# Patient Record
Sex: Male | Born: 1950 | Race: White | Hispanic: No | Marital: Married | Smoking: Former smoker
Health system: Southern US, Community
[De-identification: ages and names within clinical notes are randomized; demographics above are authoritative.]

## PROBLEM LIST (undated history)

## (undated) DIAGNOSIS — I10 Essential (primary) hypertension: Secondary | ICD-10-CM

## (undated) DIAGNOSIS — C801 Malignant (primary) neoplasm, unspecified: Secondary | ICD-10-CM

## (undated) DIAGNOSIS — M1990 Unspecified osteoarthritis, unspecified site: Secondary | ICD-10-CM

## (undated) DIAGNOSIS — I519 Heart disease, unspecified: Secondary | ICD-10-CM

## (undated) DIAGNOSIS — I2510 Atherosclerotic heart disease of native coronary artery without angina pectoris: Secondary | ICD-10-CM

## (undated) DIAGNOSIS — Z955 Presence of coronary angioplasty implant and graft: Secondary | ICD-10-CM

## (undated) DIAGNOSIS — E785 Hyperlipidemia, unspecified: Secondary | ICD-10-CM

## (undated) DIAGNOSIS — C44311 Basal cell carcinoma of skin of nose: Secondary | ICD-10-CM

## (undated) DIAGNOSIS — I219 Acute myocardial infarction, unspecified: Secondary | ICD-10-CM

## (undated) DIAGNOSIS — I25708 Atherosclerosis of coronary artery bypass graft(s), unspecified, with other forms of angina pectoris: Secondary | ICD-10-CM

## (undated) DIAGNOSIS — I255 Ischemic cardiomyopathy: Secondary | ICD-10-CM

## (undated) DIAGNOSIS — Z87442 Personal history of urinary calculi: Secondary | ICD-10-CM

## (undated) DIAGNOSIS — Z87891 Personal history of nicotine dependence: Secondary | ICD-10-CM

## (undated) HISTORY — PX: TONSILLECTOMY: SUR1361

## (undated) HISTORY — PX: BASAL CELL CARCINOMA EXCISION: SHX1214

## (undated) HISTORY — PX: CORONARY ARTERY BYPASS GRAFT: SHX141

## (undated) HISTORY — PX: CARDIAC CATHETERIZATION: SHX172

## (undated) HISTORY — PX: JOINT REPLACEMENT: SHX530

## (undated) HISTORY — DX: Basal cell carcinoma of skin of nose: C44.311

## (undated) HISTORY — DX: Hyperlipidemia, unspecified: E78.5

## (undated) HISTORY — PX: CORONARY ANGIOPLASTY: SHX604

## (undated) HISTORY — DX: Unspecified osteoarthritis, unspecified site: M19.90

## (undated) HISTORY — DX: Heart disease, unspecified: I51.9

## (undated) HISTORY — DX: Malignant (primary) neoplasm, unspecified: C80.1

## (undated) HISTORY — DX: Acute myocardial infarction, unspecified: I21.9

---

## 1996-07-19 DIAGNOSIS — I219 Acute myocardial infarction, unspecified: Secondary | ICD-10-CM

## 1996-07-19 DIAGNOSIS — Z951 Presence of aortocoronary bypass graft: Secondary | ICD-10-CM

## 1996-07-19 HISTORY — PX: CORONARY ARTERY BYPASS GRAFT: SHX141

## 1996-07-19 HISTORY — DX: Acute myocardial infarction, unspecified: I21.9

## 1996-07-19 HISTORY — DX: Presence of aortocoronary bypass graft: Z95.1

## 2002-07-19 HISTORY — PX: COLONOSCOPY: SHX174

## 2003-07-20 HISTORY — PX: ROTATOR CUFF REPAIR: SHX139

## 2004-03-31 ENCOUNTER — Other Ambulatory Visit: Payer: Self-pay

## 2006-03-10 ENCOUNTER — Ambulatory Visit: Payer: Self-pay | Admitting: Internal Medicine

## 2006-07-19 HISTORY — PX: HERNIA REPAIR: SHX51

## 2007-01-20 ENCOUNTER — Inpatient Hospital Stay: Payer: Self-pay | Admitting: Internal Medicine

## 2007-01-20 ENCOUNTER — Other Ambulatory Visit: Payer: Self-pay

## 2007-04-13 ENCOUNTER — Ambulatory Visit: Payer: Self-pay | Admitting: General Surgery

## 2007-06-23 ENCOUNTER — Ambulatory Visit: Payer: Self-pay | Admitting: Family Medicine

## 2007-07-25 ENCOUNTER — Ambulatory Visit: Payer: Self-pay | Admitting: Specialist

## 2008-06-17 ENCOUNTER — Ambulatory Visit: Payer: Self-pay | Admitting: Specialist

## 2009-05-07 LAB — BASIC METABOLIC PANEL
BUN: 14 mg/dL (ref 4–21)
CREATININE: 0.9 mg/dL (ref 0.6–1.3)
GLUCOSE: 99 mg/dL
POTASSIUM: 4.4 mmol/L (ref 3.4–5.3)
SODIUM: 140 mmol/L (ref 137–147)

## 2009-07-19 DIAGNOSIS — C801 Malignant (primary) neoplasm, unspecified: Secondary | ICD-10-CM

## 2009-07-19 HISTORY — DX: Malignant (primary) neoplasm, unspecified: C80.1

## 2011-02-01 ENCOUNTER — Ambulatory Visit: Payer: Self-pay | Admitting: Family Medicine

## 2011-03-07 ENCOUNTER — Emergency Department: Payer: Self-pay | Admitting: Emergency Medicine

## 2011-11-07 ENCOUNTER — Emergency Department: Payer: Self-pay | Admitting: Emergency Medicine

## 2011-11-16 ENCOUNTER — Emergency Department: Payer: Self-pay | Admitting: Emergency Medicine

## 2013-05-18 IMAGING — CR DG HIP COMPLETE 2+V*L*
1 series · 3 of 3 positions shown · non-contrast
Comparison: none

REASON FOR EXAM: left hip pain
COMMENTS:

[Series 1: view not recorded · 0.17mm/px · 3 of 3 slices shown]
[im 1/3]
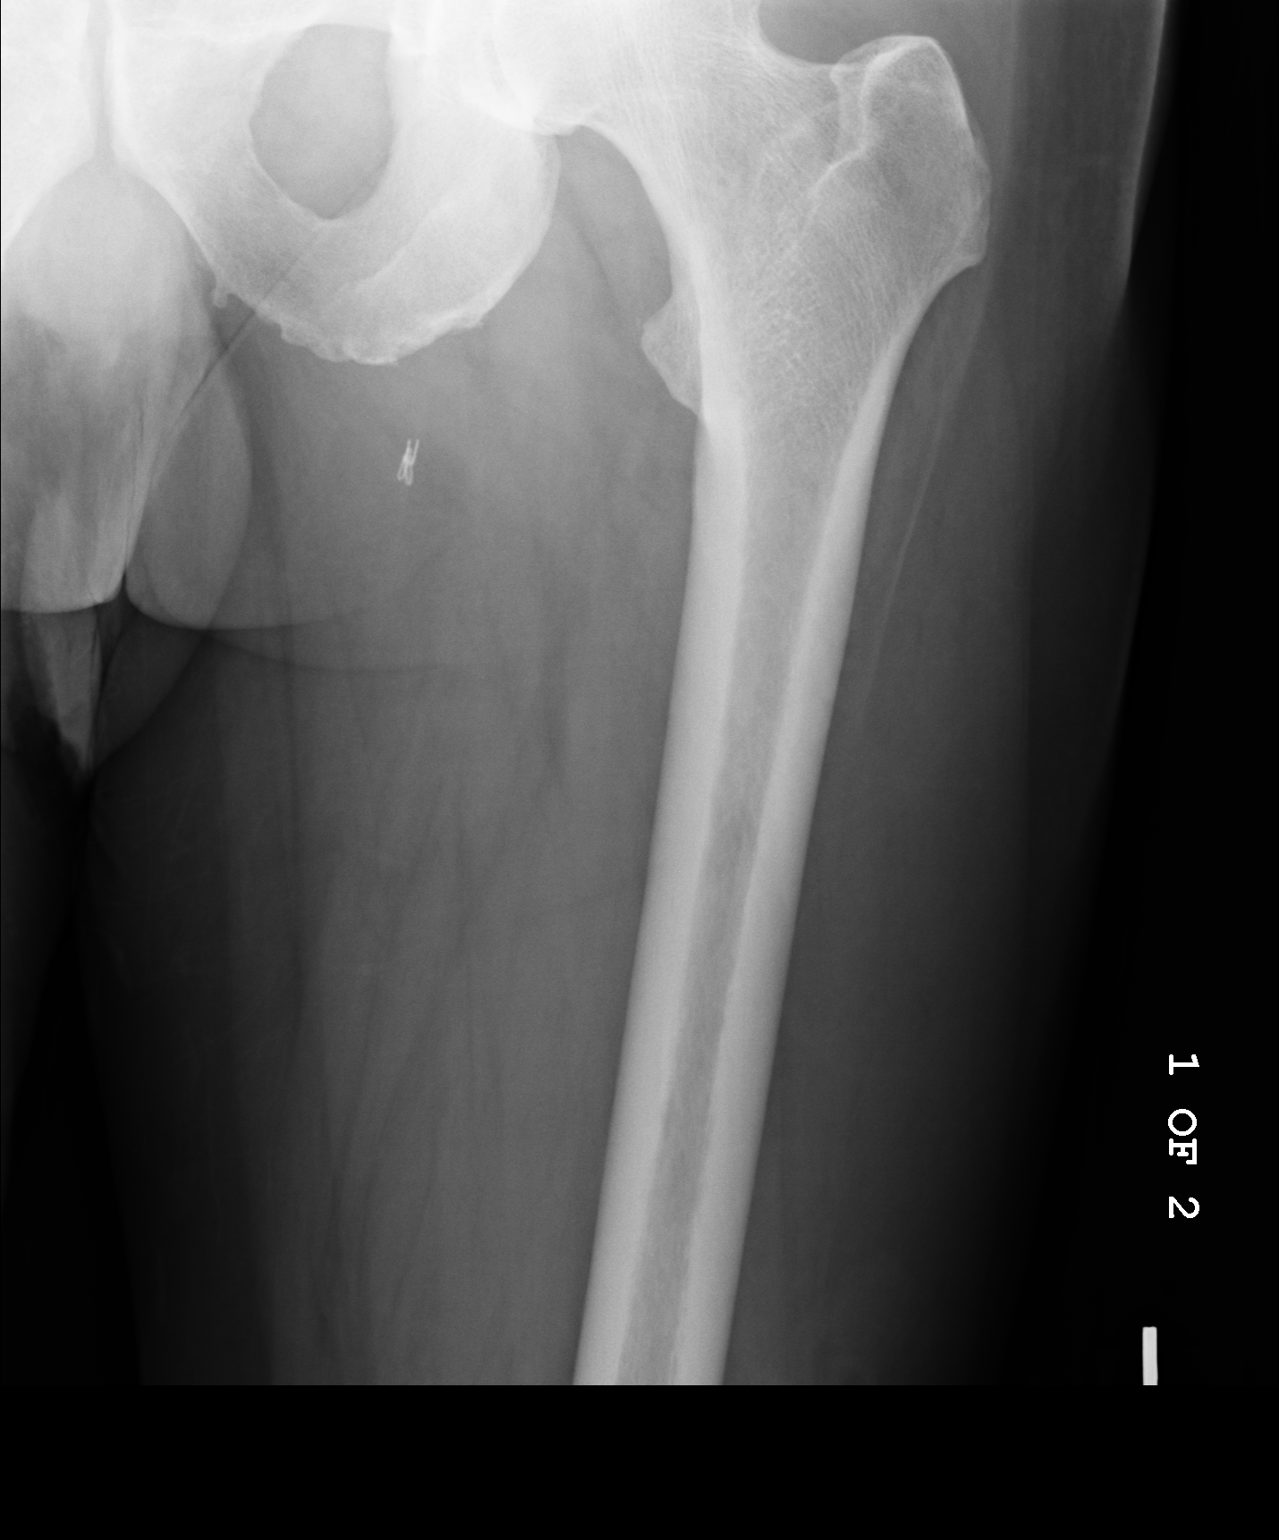
[im 2/3]
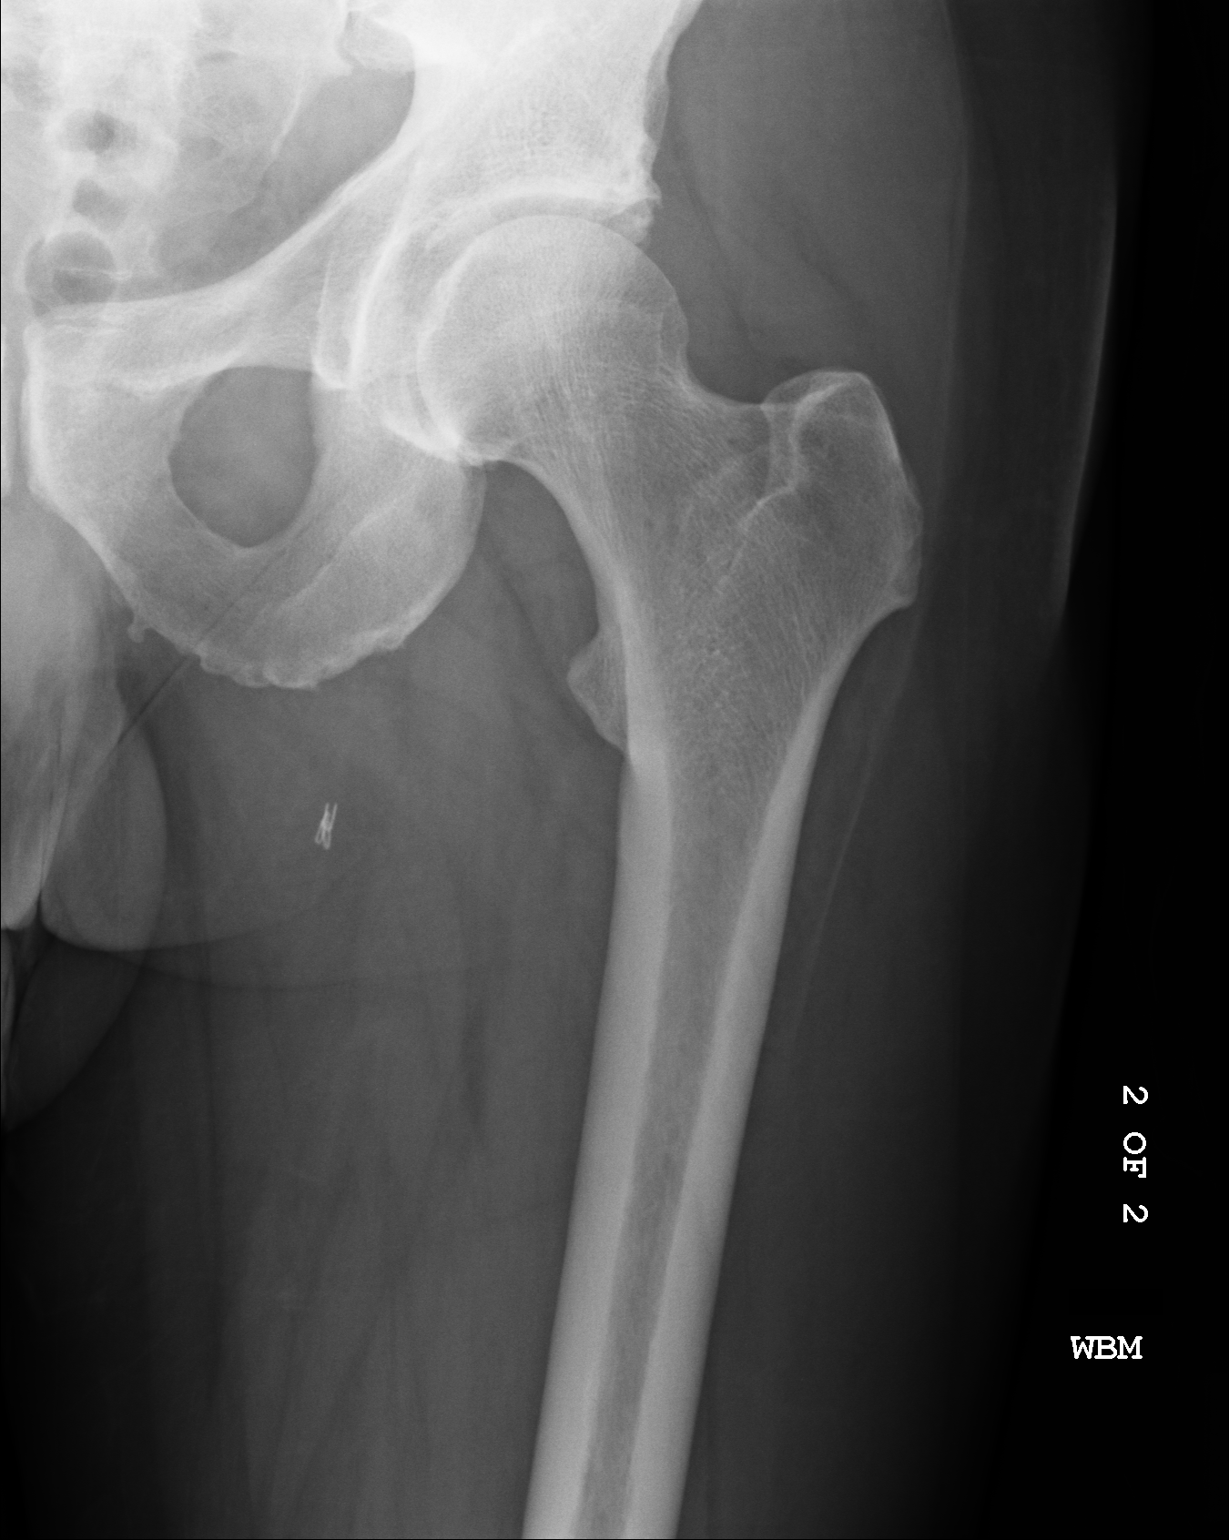
[im 3/3]
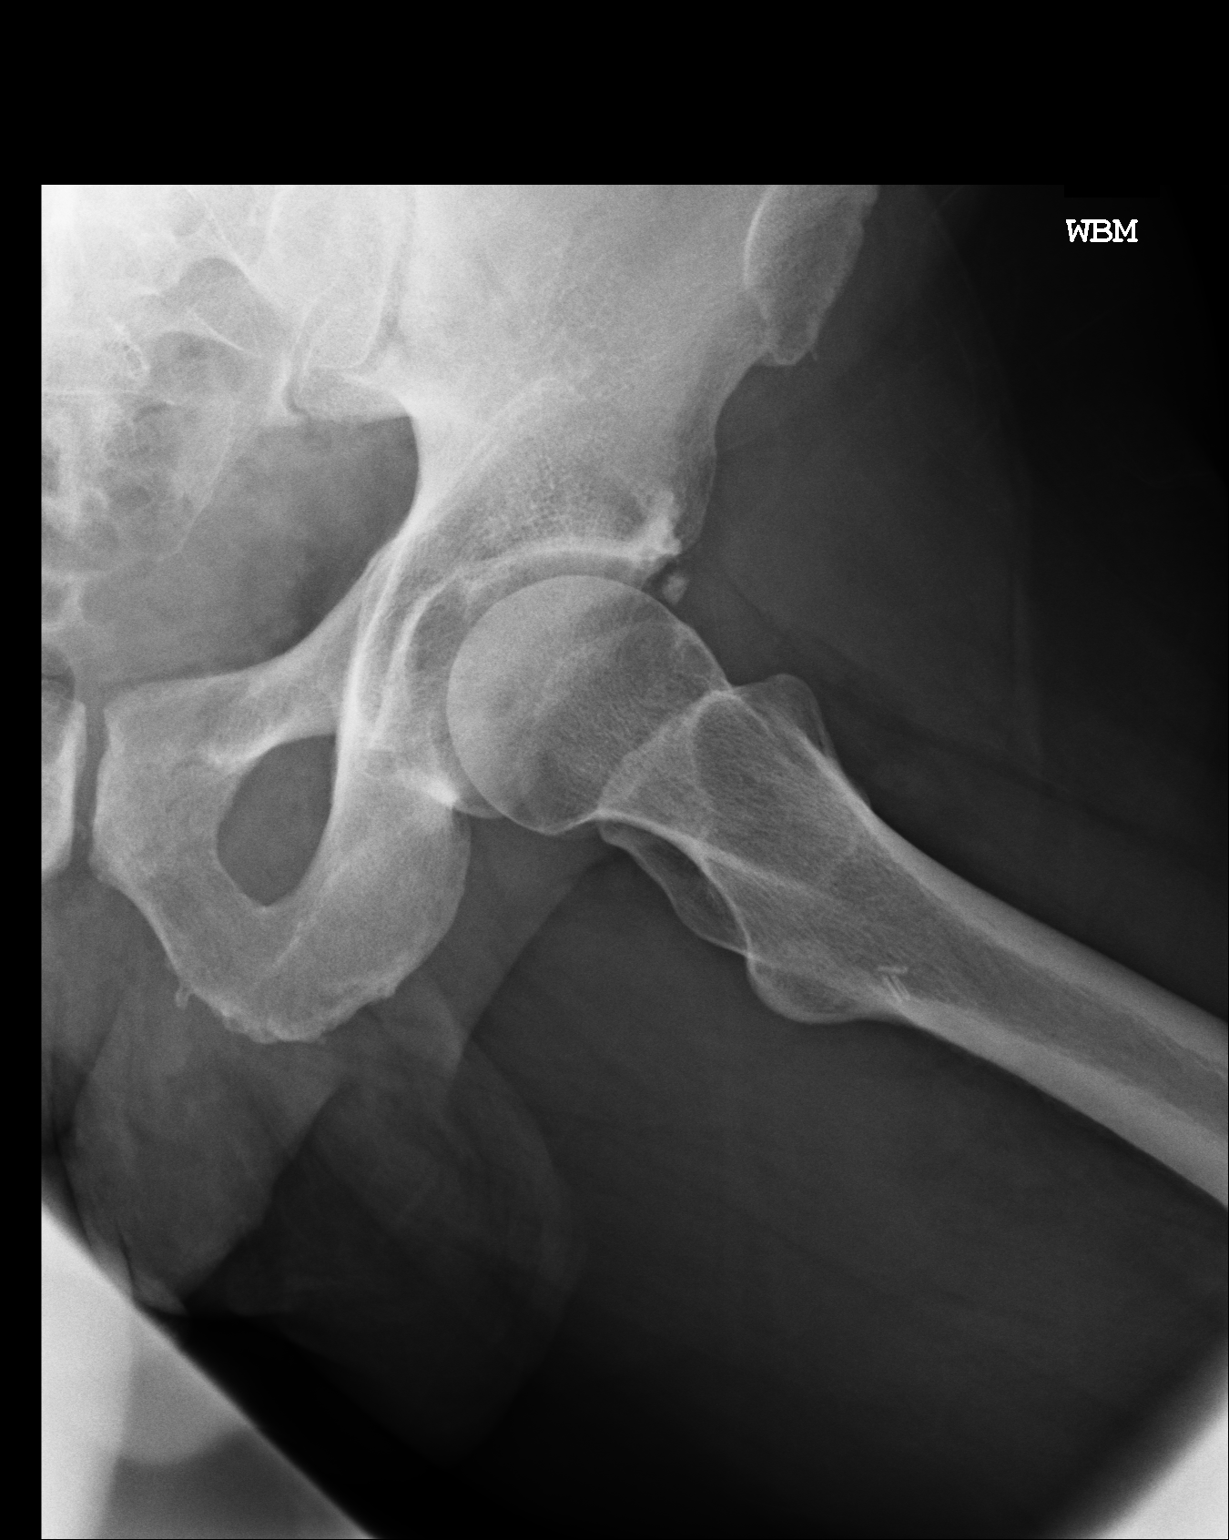

[3 of 3 positions shown; findings below may reference images not displayed]

PROCEDURE:     KDR - KDXR HIP LEFT COMPLETE  - February 01, 2011  [DATE]

RESULT:     There is no evidence of acute fracture or dislocation. There are
findings concerning for an osseous loose body within the femoral acetabular
joint. This area projects along superior lateral portion of the joint.
Osteoarthritic changes are identified evident by mild osteophytosis.
IMPRESSION: 1. No evidence of acute osseous abnormalities.
2. Findings concerning for an osseous loose body within the joint. If
clinically warranted, further evaluation with MRI is recommended as well as
orthopedic consultation.

## 2013-06-07 ENCOUNTER — Encounter: Payer: Self-pay | Admitting: *Deleted

## 2013-06-28 ENCOUNTER — Encounter: Payer: Self-pay | Admitting: General Surgery

## 2013-06-28 ENCOUNTER — Other Ambulatory Visit: Payer: Self-pay | Admitting: General Surgery

## 2013-06-28 ENCOUNTER — Ambulatory Visit (INDEPENDENT_AMBULATORY_CARE_PROVIDER_SITE_OTHER): Payer: 59 | Admitting: General Surgery

## 2013-06-28 VITALS — BP 110/66 | HR 80 | Resp 16 | Ht 63.0 in | Wt 141.0 lb

## 2013-06-28 DIAGNOSIS — K4090 Unilateral inguinal hernia, without obstruction or gangrene, not specified as recurrent: Secondary | ICD-10-CM

## 2013-06-28 NOTE — Progress Notes (Signed)
Patient ID: Bruce Moody, male   DOB: 14-Dec-1950, 62 y.o.   MRN: [id number]  Chief Complaint  Patient presents with  . Other    inguinal hernia    HPI Bruce Moody is a 62 y.o. male referred here by Dr Sherrie Mustache for a Right inguinal hernia. Patient notices this about one month ago . He states the inguinal area is popping in and out. Patient states when walking and standing the area starts to burn.   The patient reports that he has had rare episodes of angina since his bypass 16 years ago. He is able to hike in the mountains while doing his photography without symptoms. He retired from [employer]. 3 years ago.  HPI  Past Medical History  Diagnosis Date  . Heart disease   . Arthritis   . Myocardial infarction   . Cancer 2011    Melanoma    Past Surgical History  Procedure Laterality Date  . Rotator cuff repair  2005  . Coronary artery bypass graft  1998  . Hernia repair Left 2008    inguinal  . Colonoscopy  2004    Family History  Problem Relation Age of Onset  . Lymphoma Father   . Heart disease Mother     Social History History  Substance Use Topics  . Smoking status: Former Smoker    Types: Cigarettes    Quit date: 07/19/1996  . Smokeless tobacco: Never Used  . Alcohol Use: Yes    No Known Allergies  Current Outpatient Prescriptions  Medication Sig Dispense Refill  . aspirin 81 MG tablet Take 81 mg by mouth daily.      Marland Kitchen atorvastatin (LIPITOR) 40 MG tablet Take 40 mg by mouth daily at 6 PM.       . carvedilol (COREG) 3.125 MG tablet Take 3.125 mg by mouth 2 (two) times daily with a meal.       . clopidogrel (PLAVIX) 75 MG tablet Take 75 mg by mouth once.       . ramipril (ALTACE) 2.5 MG capsule Take 2.5 mg by mouth daily.        No current facility-administered medications for this visit.    Review of Systems Review of Systems  Constitutional: Negative.   Respiratory: Negative.   Cardiovascular: Negative.     Blood pressure 110/66, pulse 80, resp. rate  16, height 5\' 3"  (1.6 m), weight 141 lb (63.957 kg).  Physical Exam Physical Exam  Constitutional: He is oriented to person, place, and time. He appears well-developed and well-nourished.  Eyes: No scleral icterus.  Cardiovascular: Normal rate, regular rhythm and normal heart sounds.   Pulmonary/Chest: Breath sounds normal.  Abdominal: Soft. Normal appearance and bowel sounds are normal. There is no hepatomegaly. There is no tenderness. A hernia is present. Hernia confirmed positive in the right inguinal area. Hernia confirmed negative in the left inguinal area.  Lymphadenopathy:    He has no cervical adenopathy.  Neurological: He is alert and oriented to person, place, and time.  Skin: Skin is warm and dry.    Data Reviewed None.  Assessment    Symptomatic right inguinal hernia.     Plan    The indications for elective repair was reviewed. Plan is for the use of prosthetic mesh was discussed. His previous procedure and 2008 was completed with local anesthesia with sedation. Will have him discuss with the anesthesia staff his desires.     The patient will discontinue Plavix one week prior to  the procedure.    Patient's surgery has been scheduled for 07-05-13 at [facility]. This patient has been asked to discontinue Plavix for one week prior to procedure but aware it is ok to continue 81 mg aspirin.    Earline Mayotte 06/28/2013, 8:04 PM

## 2013-06-28 NOTE — Patient Instructions (Addendum)
Inguinal Hernia, Adult Muscles help keep everything in the body in its proper place. But if a weak spot in the muscles develops, something can poke through. That is called a hernia. When this happens in the lower part of the belly (abdomen), it is called an inguinal hernia. (It takes its name from a part of the body in this region called the inguinal canal.) A weak spot in the wall of muscles lets some fat or part of the small intestine bulge through. An inguinal hernia can develop at any age. Men get them more often than women. CAUSES  In adults, an inguinal hernia develops over time.  It can be triggered by:  Suddenly straining the muscles of the lower abdomen.  Lifting heavy objects.  Straining to have a bowel movement. Difficult bowel movements (constipation) can lead to this.  Constant coughing. This may be caused by smoking or lung disease.  Being overweight.  Being pregnant.  Working at a job that requires long periods of standing or heavy lifting.  Having had an inguinal hernia before. One type can be an emergency situation. It is called a strangulated inguinal hernia. It develops if part of the small intestine slips through the weak spot and cannot get back into the abdomen. The blood supply can be cut off. If that happens, part of the intestine may die. This situation requires emergency surgery. SYMPTOMS  Often, a small inguinal hernia has no symptoms. It is found when a healthcare provider does a physical exam. Larger hernias usually have symptoms.   In adults, symptoms may include:  A lump in the groin. This is easier to see when the person is standing. It might disappear when lying down.  In men, a lump in the scrotum.  Pain or burning in the groin. This occurs especially when lifting, straining or coughing.  A dull ache or feeling of pressure in the groin.  Signs of a strangulated hernia can include:  A bulge in the groin that becomes very painful and tender to the  touch.  A bulge that turns red or purple.  Fever, nausea and vomiting.  Inability to have a bowel movement or to pass gas. DIAGNOSIS  To decide if you have an inguinal hernia, a healthcare provider will probably do a physical examination.  This will include asking questions about any symptoms you have noticed.  The healthcare provider might feel the groin area and ask you to cough. If an inguinal hernia is felt, the healthcare provider may try to slide it back into the abdomen.  Usually no other tests are needed. TREATMENT  Treatments can vary. The size of the hernia makes a difference. Options include:  Watchful waiting. This is often suggested if the hernia is small and you have had no symptoms.  No medical procedure will be done unless symptoms develop.  You will need to watch closely for symptoms. If any occur, contact your healthcare provider right away.  Surgery. This is used if the hernia is larger or you have symptoms.  Open surgery. This is usually an outpatient procedure (you will not stay overnight in a hospital). An cut (incision) is made through the skin in the groin. The hernia is put back inside the abdomen. The weak area in the muscles is then repaired by herniorrhaphy or hernioplasty. Herniorrhaphy: in this type of surgery, the weak muscles are sewn back together. Hernioplasty: a patch or mesh is used to close the weak area in the abdominal wall.  Laparoscopy.   In this procedure, a surgeon makes small incisions. A thin tube with a tiny video camera (called a laparoscope) is put into the abdomen. The surgeon repairs the hernia with mesh by looking with the video camera and using two long instruments. HOME CARE INSTRUCTIONS   After surgery to repair an inguinal hernia:  You will need to take pain medicine prescribed by your healthcare provider. Follow all directions carefully.  You will need to take care of the wound from the incision.  Your activity will be  restricted for awhile. This will probably include no heavy lifting for several weeks. You also should not do anything too active for a few weeks. When you can return to work will depend on the type of job that you have.  During "watchful waiting" periods, you should:  Maintain a healthy weight.  Eat a diet high in fiber (fruits, vegetables and whole grains).  Drink plenty of fluids to avoid constipation. This means drinking enough water and other liquids to keep your urine clear or pale yellow.  Do not lift heavy objects.  Do not stand for long periods of time.  Quit smoking. This should keep you from developing a frequent cough. SEEK MEDICAL CARE IF:   A bulge develops in your groin area.  You feel pain, a burning sensation or pressure in the groin. This might be worse if you are lifting or straining.  You develop a fever of more than 100.5 F (38.1 C). SEEK IMMEDIATE MEDICAL CARE IF:   Pain in the groin increases suddenly.  A bulge in the groin gets bigger suddenly and does not go down.  For men, there is sudden pain in the scrotum. Or, the size of the scrotum increases.  A bulge in the groin area becomes red or purple and is painful to touch.  You have nausea or vomiting that does not go away.  You feel your heart beating much faster than normal.  You cannot have a bowel movement or pass gas.  You develop a fever of more than 102.0 F (38.9 C). Document Released: 11/21/2008 Document Revised: 09/27/2011 Document Reviewed: 11/21/2008 [facility] Patient Information 2014 [place], [place].  Patient's surgery has been scheduled for 07-05-13 at [facility]. This patient has been asked to discontinue Plavix for one week prior to procedure but aware it is ok to continue 81 mg aspirin.

## 2013-07-03 ENCOUNTER — Ambulatory Visit: Payer: Self-pay | Admitting: General Surgery

## 2013-07-03 LAB — CBC WITH DIFFERENTIAL/PLATELET
Basophil #: 0 10*3/uL (ref 0.0–0.1)
Basophil %: 0.7 %
Eosinophil #: 0.2 10*3/uL (ref 0.0–0.7)
Eosinophil %: 2.9 %
HGB: 15.2 g/dL (ref 13.0–18.0)
Lymphocyte #: 2.3 10*3/uL (ref 1.0–3.6)
Lymphocyte %: 33.6 %
MCH: 32.3 pg (ref 26.0–34.0)
Monocyte #: 0.8 x10 3/mm (ref 0.2–1.0)
Monocyte %: 12.4 %
Neutrophil %: 50.4 %
Platelet: 240 10*3/uL (ref 150–440)
RDW: 12.8 % (ref 11.5–14.5)
WBC: 6.7 10*3/uL (ref 3.8–10.6)

## 2013-07-05 ENCOUNTER — Ambulatory Visit: Payer: Self-pay | Admitting: General Surgery

## 2013-07-05 DIAGNOSIS — K4090 Unilateral inguinal hernia, without obstruction or gangrene, not specified as recurrent: Secondary | ICD-10-CM

## 2013-07-05 HISTORY — PX: HERNIA REPAIR: SHX51

## 2013-07-09 ENCOUNTER — Encounter: Payer: Self-pay | Admitting: General Surgery

## 2013-07-18 ENCOUNTER — Ambulatory Visit (INDEPENDENT_AMBULATORY_CARE_PROVIDER_SITE_OTHER): Payer: 59 | Admitting: General Surgery

## 2013-07-18 ENCOUNTER — Encounter: Payer: Self-pay | Admitting: General Surgery

## 2013-07-18 VITALS — BP 128/72 | HR 76 | Resp 12 | Ht 63.0 in | Wt 142.0 lb

## 2013-07-18 DIAGNOSIS — K4090 Unilateral inguinal hernia, without obstruction or gangrene, not specified as recurrent: Secondary | ICD-10-CM

## 2013-07-18 NOTE — Progress Notes (Signed)
Patient ID: Bruce Moody, male   DOB: 1951-01-22, 62 y.o.   MRN: [id number]  Chief Complaint  Patient presents with  . Routine Post Op    HPI Bruce Moody is a 62 y.o. male.  Here today for postoperative visit he had repair of right inguinal hernia 07-05-13. Repair was completed making use of a large Ultra Pro mesh. States he is getting a long well.  HPI  Past Medical History  Diagnosis Date  . Heart disease   . Arthritis   . Myocardial infarction   . Cancer 2011    Melanoma    Past Surgical History  Procedure Laterality Date  . Rotator cuff repair  2005  . Coronary artery bypass graft  1998  . Colonoscopy  2004  . Hernia repair Left 2008    inguinal  . Hernia repair Right 07-05-13    inguinal     Family History  Problem Relation Age of Onset  . Lymphoma Father   . Heart disease Mother     Social History History  Substance Use Topics  . Smoking status: Former Smoker    Types: Cigarettes    Quit date: 07/19/1996  . Smokeless tobacco: Never Used  . Alcohol Use: Yes    No Known Allergies  Current Outpatient Prescriptions  Medication Sig Dispense Refill  . aspirin 81 MG tablet Take 81 mg by mouth daily.      Marland Kitchen atorvastatin (LIPITOR) 40 MG tablet Take 40 mg by mouth daily at 6 PM.       . carvedilol (COREG) 3.125 MG tablet Take 3.125 mg by mouth 2 (two) times daily with a meal.       . clopidogrel (PLAVIX) 75 MG tablet Take 75 mg by mouth once.       . ramipril (ALTACE) 2.5 MG capsule Take 2.5 mg by mouth daily.        No current facility-administered medications for this visit.    Review of Systems Review of Systems  Constitutional: Negative.   Respiratory: Negative.   Cardiovascular: Negative.     Blood pressure 128/72, pulse 76, resp. rate 12, height 5\' 3"  (1.6 m), weight 142 lb (64.411 kg).  Physical Exam Physical Exam  Constitutional: He is oriented to person, place, and time. He appears well-developed and well-nourished.  Abdominal: Hernia  confirmed negative in the right inguinal area.  Neurological: He is alert and oriented to person, place, and time.  Skin: Skin is warm and dry.  Right inguinal incision healing well.      Assessment    Doing well status post right inguinal hernia repair.     Plan    Patient may resume activities as tolerated, taking care to use proper technique when lifting (demonstrated).       Earline Mayotte 07/20/2013, 1:51 PM

## 2013-07-18 NOTE — Patient Instructions (Addendum)
Patient may resume activities as tolerated, taking care to use proper technique when lifting. 

## 2013-08-02 ENCOUNTER — Encounter: Payer: Self-pay | Admitting: General Surgery

## 2013-11-20 DIAGNOSIS — I25810 Atherosclerosis of coronary artery bypass graft(s) without angina pectoris: Secondary | ICD-10-CM | POA: Insufficient documentation

## 2013-11-20 DIAGNOSIS — Z955 Presence of coronary angioplasty implant and graft: Secondary | ICD-10-CM | POA: Insufficient documentation

## 2013-11-21 DIAGNOSIS — C44121 Squamous cell carcinoma of skin of unspecified eyelid, including canthus: Secondary | ICD-10-CM | POA: Insufficient documentation

## 2014-02-20 LAB — CBC AND DIFFERENTIAL
HEMATOCRIT: 44 % (ref 41–53)
Hemoglobin: 15.6 g/dL (ref 13.5–17.5)
Platelets: 285 10*3/uL (ref 150–399)
WBC: 8.8 10^3/mL

## 2014-02-20 LAB — PSA: PSA: 1.3

## 2014-02-20 LAB — TSH: TSH: 3.47 u[IU]/mL (ref 0.41–5.90)

## 2014-04-02 DIAGNOSIS — E782 Mixed hyperlipidemia: Secondary | ICD-10-CM | POA: Insufficient documentation

## 2014-04-02 DIAGNOSIS — I255 Ischemic cardiomyopathy: Secondary | ICD-10-CM | POA: Insufficient documentation

## 2014-04-02 DIAGNOSIS — I10 Essential (primary) hypertension: Secondary | ICD-10-CM | POA: Insufficient documentation

## 2014-04-02 DIAGNOSIS — I2510 Atherosclerotic heart disease of native coronary artery without angina pectoris: Secondary | ICD-10-CM | POA: Insufficient documentation

## 2014-05-28 ENCOUNTER — Ambulatory Visit: Payer: Self-pay | Admitting: Gastroenterology

## 2014-05-28 LAB — HM COLONOSCOPY

## 2014-11-08 NOTE — Op Note (Signed)
PATIENT NAMEMARKISE, Bruce Moody MR#:  [id number] DATE OF BIRTH:  March 14, 1951  DATE OF PROCEDURE:  07/05/2013  PREOPERATIVE DIAGNOSIS:  Symptomatic right inguinal hernia.   POSTOPERATIVE DIAGNOSIS: Symptomatic right inguinal hernia.   OPERATIVE PROCEDURE: Right inguinal hernia repair with large Ultrapro mesh.   OPERATING SURGEON: Hervey Ard, MD   ANESTHESIA: General by LMA, Marcaine 0.5% with 1:200,000 units of epinephrine, 30 mL local infiltration, Toradol 30 mg.   ESTIMATED BLOOD LOSS: Minimal.   CLINICAL NOTE: This 64 year old male has developed symptomatic inguinal hernia. He was admitted for elective repair. He received Kefzol prior to the procedure.   OPERATIVE NOTE: With the patient under adequate general anesthesia, the area was prepped with ChloraPrep and draped. Hair had previously been removed with clippers. A 5 cm skin line incision was made along the anticipated path of the inguinal canal and carried down through the skin and subcutaneous tissue, with hemostasis achieved by electrocautery and 3-0 Vicryl ties. The external oblique was opened in the direction of its fibers. The cord was mobilized. Dissection of the level of the internal ring showed a lipoma of the cord, which was excised and discarded. There was no evidence of an indirect sac. There was a sizable direct defect. The sac was excised. The preperitoneal space was cleared, and a large Ultrapro mesh smoothed into a preperitoneal space and then the external component along the floor of the inguinal canal. The mesh was anchored to the pubic tubercle with 0 Surgilon, and interrupted 0 Surgilon sutures were used along the course of the inguinal ligament as well as the medial and superior borders to the transverse abdominis aponeurosis. The hypogastric and ilioinguinal nerves were identified and protected. The external oblique was closed in the direction of its fibers after the instillation of Toradol. Scarpa's fascia was closed  with 3-0 Vicryl, and the skin closed with a running 4-0 Vicryl subcuticular suture. Benzoin, Steri-Strips, Telfa, and Tegaderm dressing was then applied.   The patient tolerated the procedure well, was taken to the recovery room in stable condition.     ____________________________ Robert Bellow, MD jwb:mr D: 07/05/2013 20:08:27 ET T: 07/05/2013 20:39:17 ET JOB#: [id number]  cc: Robert Bellow, MD, <Dictator> Kirstie Peri. Caryn Section, MD  Adonis Yim Amedeo Kinsman MD ELECTRONICALLY SIGNED 07/06/2013 22:10

## 2015-03-05 LAB — HEPATIC FUNCTION PANEL
ALK PHOS: 80 U/L (ref 25–125)
ALT: 31 U/L (ref 10–40)
AST: 30 U/L (ref 14–40)
BILIRUBIN, TOTAL: 0.9 mg/dL

## 2015-03-05 LAB — LIPID PANEL
Cholesterol: 162 mg/dL (ref 0–200)
HDL: 53 mg/dL (ref 35–70)
LDL Cholesterol: 92 mg/dL
LDl/HDL Ratio: 3
TRIGLYCERIDES: 83 mg/dL (ref 40–160)

## 2015-06-05 ENCOUNTER — Ambulatory Visit (INDEPENDENT_AMBULATORY_CARE_PROVIDER_SITE_OTHER): Payer: Medicare Other

## 2015-06-05 DIAGNOSIS — Z23 Encounter for immunization: Secondary | ICD-10-CM | POA: Diagnosis not present

## 2015-09-30 DIAGNOSIS — I25810 Atherosclerosis of coronary artery bypass graft(s) without angina pectoris: Secondary | ICD-10-CM | POA: Diagnosis not present

## 2015-09-30 DIAGNOSIS — I2510 Atherosclerotic heart disease of native coronary artery without angina pectoris: Secondary | ICD-10-CM | POA: Diagnosis not present

## 2015-09-30 DIAGNOSIS — E782 Mixed hyperlipidemia: Secondary | ICD-10-CM | POA: Diagnosis not present

## 2015-09-30 DIAGNOSIS — I10 Essential (primary) hypertension: Secondary | ICD-10-CM | POA: Diagnosis not present

## 2015-09-30 DIAGNOSIS — I255 Ischemic cardiomyopathy: Secondary | ICD-10-CM | POA: Diagnosis not present

## 2015-11-26 DIAGNOSIS — L570 Actinic keratosis: Secondary | ICD-10-CM | POA: Diagnosis not present

## 2015-11-26 DIAGNOSIS — X32XXXA Exposure to sunlight, initial encounter: Secondary | ICD-10-CM | POA: Diagnosis not present

## 2015-11-26 DIAGNOSIS — Z85828 Personal history of other malignant neoplasm of skin: Secondary | ICD-10-CM | POA: Diagnosis not present

## 2015-11-26 DIAGNOSIS — Z08 Encounter for follow-up examination after completed treatment for malignant neoplasm: Secondary | ICD-10-CM | POA: Diagnosis not present

## 2015-11-26 DIAGNOSIS — D485 Neoplasm of uncertain behavior of skin: Secondary | ICD-10-CM | POA: Diagnosis not present

## 2015-11-26 DIAGNOSIS — D0439 Carcinoma in situ of skin of other parts of face: Secondary | ICD-10-CM | POA: Diagnosis not present

## 2015-11-26 DIAGNOSIS — Z85820 Personal history of malignant melanoma of skin: Secondary | ICD-10-CM | POA: Diagnosis not present

## 2015-11-26 DIAGNOSIS — D0339 Melanoma in situ of other parts of face: Secondary | ICD-10-CM | POA: Diagnosis not present

## 2015-12-29 DIAGNOSIS — N200 Calculus of kidney: Secondary | ICD-10-CM | POA: Insufficient documentation

## 2015-12-29 DIAGNOSIS — N529 Male erectile dysfunction, unspecified: Secondary | ICD-10-CM | POA: Insufficient documentation

## 2015-12-29 DIAGNOSIS — K5792 Diverticulitis of intestine, part unspecified, without perforation or abscess without bleeding: Secondary | ICD-10-CM | POA: Insufficient documentation

## 2015-12-29 DIAGNOSIS — I509 Heart failure, unspecified: Secondary | ICD-10-CM | POA: Insufficient documentation

## 2016-01-12 DIAGNOSIS — L908 Other atrophic disorders of skin: Secondary | ICD-10-CM | POA: Diagnosis not present

## 2016-01-12 DIAGNOSIS — D0439 Carcinoma in situ of skin of other parts of face: Secondary | ICD-10-CM | POA: Diagnosis not present

## 2016-01-12 DIAGNOSIS — L578 Other skin changes due to chronic exposure to nonionizing radiation: Secondary | ICD-10-CM | POA: Diagnosis not present

## 2016-01-12 DIAGNOSIS — L814 Other melanin hyperpigmentation: Secondary | ICD-10-CM | POA: Diagnosis not present

## 2016-01-21 DIAGNOSIS — T8131XA Disruption of external operation (surgical) wound, not elsewhere classified, initial encounter: Secondary | ICD-10-CM | POA: Diagnosis not present

## 2016-04-07 DIAGNOSIS — I25810 Atherosclerosis of coronary artery bypass graft(s) without angina pectoris: Secondary | ICD-10-CM | POA: Diagnosis not present

## 2016-04-07 DIAGNOSIS — I10 Essential (primary) hypertension: Secondary | ICD-10-CM | POA: Diagnosis not present

## 2016-04-07 DIAGNOSIS — E782 Mixed hyperlipidemia: Secondary | ICD-10-CM | POA: Diagnosis not present

## 2016-04-07 DIAGNOSIS — M25552 Pain in left hip: Secondary | ICD-10-CM | POA: Diagnosis not present

## 2016-04-12 DIAGNOSIS — M25552 Pain in left hip: Secondary | ICD-10-CM | POA: Diagnosis not present

## 2016-04-12 DIAGNOSIS — M5440 Lumbago with sciatica, unspecified side: Secondary | ICD-10-CM | POA: Diagnosis not present

## 2016-04-12 DIAGNOSIS — M1612 Unilateral primary osteoarthritis, left hip: Secondary | ICD-10-CM | POA: Diagnosis not present

## 2016-04-12 DIAGNOSIS — M5136 Other intervertebral disc degeneration, lumbar region: Secondary | ICD-10-CM | POA: Diagnosis not present

## 2016-05-03 DIAGNOSIS — M1612 Unilateral primary osteoarthritis, left hip: Secondary | ICD-10-CM | POA: Diagnosis not present

## 2016-05-20 ENCOUNTER — Ambulatory Visit (INDEPENDENT_AMBULATORY_CARE_PROVIDER_SITE_OTHER): Payer: Medicare Other

## 2016-05-20 DIAGNOSIS — Z23 Encounter for immunization: Secondary | ICD-10-CM | POA: Diagnosis not present

## 2016-05-26 ENCOUNTER — Encounter
Admission: RE | Admit: 2016-05-26 | Discharge: 2016-05-26 | Disposition: A | Discharge status: Home / Self Care | Payer: Medicare Other | Source: Ambulatory Visit | Attending: Orthopedic Surgery | Admitting: Orthopedic Surgery

## 2016-05-26 DIAGNOSIS — Z01818 Encounter for other preprocedural examination: Secondary | ICD-10-CM | POA: Diagnosis not present

## 2016-05-26 DIAGNOSIS — I2510 Atherosclerotic heart disease of native coronary artery without angina pectoris: Secondary | ICD-10-CM | POA: Diagnosis not present

## 2016-05-26 HISTORY — DX: Essential (primary) hypertension: I10

## 2016-05-26 HISTORY — DX: Hyperlipidemia, unspecified: E78.5

## 2016-05-26 HISTORY — DX: Atherosclerotic heart disease of native coronary artery without angina pectoris: I25.10

## 2016-05-26 LAB — SURGICAL PCR SCREEN
MRSA, PCR: NEGATIVE
Staphylococcus aureus: NEGATIVE

## 2016-05-26 LAB — URINALYSIS COMPLETE WITH MICROSCOPIC (ARMC ONLY)
BACTERIA UA: NONE SEEN
Bilirubin Urine: NEGATIVE
Glucose, UA: NEGATIVE mg/dL
Ketones, ur: NEGATIVE mg/dL
Leukocytes, UA: NEGATIVE
NITRITE: NEGATIVE
PROTEIN: NEGATIVE mg/dL
SPECIFIC GRAVITY, URINE: 1.015 (ref 1.005–1.030)
Squamous Epithelial / LPF: NONE SEEN
WBC UA: NONE SEEN WBC/hpf (ref 0–5)
pH: 5 (ref 5.0–8.0)

## 2016-05-26 LAB — BASIC METABOLIC PANEL
Anion gap: 7 (ref 5–15)
BUN: 15 mg/dL (ref 6–20)
CALCIUM: 8.8 mg/dL — AB (ref 8.9–10.3)
CO2: 27 mmol/L (ref 22–32)
CREATININE: 0.82 mg/dL (ref 0.61–1.24)
Chloride: 103 mmol/L (ref 101–111)
GFR calc Af Amer: >60 mL/min (ref >60)
Glucose, Bld: 82 mg/dL (ref 65–99)
Potassium: 3.8 mmol/L (ref 3.5–5.1)
SODIUM: 137 mmol/L (ref 135–145)

## 2016-05-26 LAB — CBC
HCT: 43.9 % (ref 40.0–52.0)
Hemoglobin: 15 g/dL (ref 13.0–18.0)
MCH: 32.7 pg (ref 26.0–34.0)
MCHC: 34.2 g/dL (ref 32.0–36.0)
MCV: 95.7 fL (ref 80.0–100.0)
PLATELETS: 237 10*3/uL (ref 150–440)
RBC: 4.59 MIL/uL (ref 4.40–5.90)
RDW: 13 % (ref 11.5–14.5)
WBC: 6.9 10*3/uL (ref 3.8–10.6)

## 2016-05-26 LAB — PROTIME-INR
INR: 0.93
PROTHROMBIN TIME: 12.5 s (ref 11.4–15.2)

## 2016-05-26 LAB — SEDIMENTATION RATE: SED RATE: 7 mm/h (ref 0–20)

## 2016-05-26 LAB — TYPE AND SCREEN
ABO/RH(D): O POS
ANTIBODY SCREEN: NEGATIVE

## 2016-05-26 LAB — APTT: aPTT: 34 seconds (ref 24–36)

## 2016-05-26 NOTE — Patient Instructions (Signed)
  Your procedure is scheduled on: 06/08/16 Tues Report to Same Day Surgery 2nd floor medical mall To find out your arrival time please call [phone] between 1PM - 3PM on 06/07/16 Mon  Remember: Instructions that are not followed completely may result in serious medical risk, up to and including death, or upon the discretion of your surgeon and anesthesiologist your surgery may need to be rescheduled.    _x___ 1. Do not eat food or drink liquids after midnight. No gum chewing or hard candies.     __x__ 2. No Alcohol for 24 hours before or after surgery.   __x__3. No Smoking for 24 prior to surgery.   ____  4. Bring all medications with you on the day of surgery if instructed.    __x__ 5. Notify your doctor if there is any change in your medical condition     (cold, fever, infections).     Do not wear jewelry, make-up, hairpins, clips or nail polish.  Do not wear lotions, powders, or perfumes. You may wear deodorant.  Do not shave 48 hours prior to surgery. Men may shave face and neck.  Do not bring valuables to the hospital.    [facility] is not responsible for any belongings or valuables.               Contacts, dentures or bridgework may not be worn into surgery.  Leave your suitcase in the car. After surgery it may be brought to your room.  For patients admitted to the hospital, discharge time is determined by your treatment team.   Patients discharged the day of surgery will not be allowed to drive home.    Please read over the following fact sheets that you were given:   [facility] Preparing for Surgery and or MRSA Information   _x___ Take these medicines the morning of surgery with A SIP OF WATER:    1. carvedilol (COREG) 3.125 MG tablet  2.  3.  4.  5.  6.  ____Fleets enema or Magnesium Citrate as directed.   _x___ Use CHG Soap or sage wipes as directed on instruction sheet   ____ Use inhalers on the day of surgery and bring to hospital day of  surgery  ____ Stop metformin 2 days prior to surgery    ____ Take 1/2 of usual insulin dose the night before surgery and none on the morning of           surgery.   _x___ Stop aspirin or coumadin, or plavix  Stop Plavix 5 days before surgery  Stop aspirin 1 week before surgery  x__ Stop Anti-inflammatories such as Advil, Aleve, Ibuprofen, Motrin, Naproxen,          Naprosyn, Goodies powders or aspirin products. Ok to take Tylenol.   ____ Stop supplements until after surgery.    ____ Bring C-Pap to the hospital.

## 2016-05-27 LAB — URINE CULTURE: Culture: NO GROWTH

## 2016-05-27 NOTE — Pre-Procedure Instructions (Signed)
ekg compared to previous and same noted prior to 1998

## 2016-06-02 DIAGNOSIS — Z08 Encounter for follow-up examination after completed treatment for malignant neoplasm: Secondary | ICD-10-CM | POA: Diagnosis not present

## 2016-06-02 DIAGNOSIS — Z85828 Personal history of other malignant neoplasm of skin: Secondary | ICD-10-CM | POA: Diagnosis not present

## 2016-06-02 DIAGNOSIS — L570 Actinic keratosis: Secondary | ICD-10-CM | POA: Diagnosis not present

## 2016-06-02 DIAGNOSIS — X32XXXA Exposure to sunlight, initial encounter: Secondary | ICD-10-CM | POA: Diagnosis not present

## 2016-06-02 DIAGNOSIS — D0439 Carcinoma in situ of skin of other parts of face: Secondary | ICD-10-CM | POA: Diagnosis not present

## 2016-06-02 DIAGNOSIS — Z85820 Personal history of malignant melanoma of skin: Secondary | ICD-10-CM | POA: Diagnosis not present

## 2016-06-08 ENCOUNTER — Encounter: Payer: Self-pay | Admitting: *Deleted

## 2016-06-08 ENCOUNTER — Inpatient Hospital Stay: Payer: Medicare Other

## 2016-06-08 ENCOUNTER — Encounter
Admission: RE | Disposition: A | Discharge status: Home Health | Payer: Self-pay | Source: Ambulatory Visit | Attending: Orthopedic Surgery

## 2016-06-08 ENCOUNTER — Inpatient Hospital Stay
Admission: RE | Admit: 2016-06-08 | Discharge: 2016-06-10 | DRG: 470 | Disposition: A | Discharge status: Home Health | Payer: Medicare Other | Source: Ambulatory Visit | Attending: Orthopedic Surgery | Admitting: Orthopedic Surgery

## 2016-06-08 ENCOUNTER — Inpatient Hospital Stay: Payer: Medicare Other | Admitting: Certified Registered"

## 2016-06-08 DIAGNOSIS — Z7902 Long term (current) use of antithrombotics/antiplatelets: Secondary | ICD-10-CM

## 2016-06-08 DIAGNOSIS — Z471 Aftercare following joint replacement surgery: Secondary | ICD-10-CM | POA: Diagnosis not present

## 2016-06-08 DIAGNOSIS — Z419 Encounter for procedure for purposes other than remedying health state, unspecified: Secondary | ICD-10-CM

## 2016-06-08 DIAGNOSIS — Z807 Family history of other malignant neoplasms of lymphoid, hematopoietic and related tissues: Secondary | ICD-10-CM | POA: Diagnosis not present

## 2016-06-08 DIAGNOSIS — Z951 Presence of aortocoronary bypass graft: Secondary | ICD-10-CM

## 2016-06-08 DIAGNOSIS — Z7982 Long term (current) use of aspirin: Secondary | ICD-10-CM

## 2016-06-08 DIAGNOSIS — R2689 Other abnormalities of gait and mobility: Secondary | ICD-10-CM

## 2016-06-08 DIAGNOSIS — M1612 Unilateral primary osteoarthritis, left hip: Principal | ICD-10-CM | POA: Diagnosis present

## 2016-06-08 DIAGNOSIS — I959 Hypotension, unspecified: Secondary | ICD-10-CM | POA: Diagnosis present

## 2016-06-08 DIAGNOSIS — Z85820 Personal history of malignant melanoma of skin: Secondary | ICD-10-CM | POA: Diagnosis not present

## 2016-06-08 DIAGNOSIS — D62 Acute posthemorrhagic anemia: Secondary | ICD-10-CM | POA: Diagnosis not present

## 2016-06-08 DIAGNOSIS — M6281 Muscle weakness (generalized): Secondary | ICD-10-CM

## 2016-06-08 DIAGNOSIS — E785 Hyperlipidemia, unspecified: Secondary | ICD-10-CM | POA: Diagnosis present

## 2016-06-08 DIAGNOSIS — I509 Heart failure, unspecified: Secondary | ICD-10-CM | POA: Diagnosis present

## 2016-06-08 DIAGNOSIS — I2510 Atherosclerotic heart disease of native coronary artery without angina pectoris: Secondary | ICD-10-CM | POA: Diagnosis present

## 2016-06-08 DIAGNOSIS — I130 Hypertensive heart and chronic kidney disease with heart failure and stage 1 through stage 4 chronic kidney disease, or unspecified chronic kidney disease: Secondary | ICD-10-CM | POA: Diagnosis not present

## 2016-06-08 DIAGNOSIS — I110 Hypertensive heart disease with heart failure: Secondary | ICD-10-CM | POA: Diagnosis present

## 2016-06-08 DIAGNOSIS — I252 Old myocardial infarction: Secondary | ICD-10-CM | POA: Diagnosis not present

## 2016-06-08 DIAGNOSIS — Z955 Presence of coronary angioplasty implant and graft: Secondary | ICD-10-CM

## 2016-06-08 DIAGNOSIS — G8918 Other acute postprocedural pain: Secondary | ICD-10-CM

## 2016-06-08 DIAGNOSIS — Z96642 Presence of left artificial hip joint: Secondary | ICD-10-CM | POA: Diagnosis not present

## 2016-06-08 DIAGNOSIS — M25552 Pain in left hip: Secondary | ICD-10-CM

## 2016-06-08 HISTORY — PX: TOTAL HIP ARTHROPLASTY: SHX124

## 2016-06-08 LAB — ABO/RH: ABO/RH(D): O POS

## 2016-06-08 SURGERY — ARTHROPLASTY, HIP, TOTAL, ANTERIOR APPROACH
Anesthesia: Spinal | Site: Hip | Laterality: Left | Wound class: Clean

## 2016-06-08 MED ORDER — LIDOCAINE HCL (PF) 2 % IJ SOLN
INTRAMUSCULAR | Status: DC | PRN
  Administered 2016-06-08: 50 mg

## 2016-06-08 MED ORDER — BUPIVACAINE HCL (PF) 0.5 % IJ SOLN
INTRAMUSCULAR | Status: DC | PRN
  Administered 2016-06-08: 3 mL via INTRATHECAL

## 2016-06-08 MED ORDER — MAGNESIUM CITRATE PO SOLN
1.0000 | Freq: Once | ORAL | Status: DC | PRN
  Filled 2016-06-08: qty 296

## 2016-06-08 MED ORDER — TRANEXAMIC ACID 1000 MG/10ML IV SOLN
INTRAVENOUS | Status: DC | PRN
  Administered 2016-06-08: 1000 mg via TOPICAL

## 2016-06-08 MED ORDER — PHENOL 1.4 % MT LIQD
1.0000 | OROMUCOSAL | Status: DC | PRN
  Filled 2016-06-08: qty 177

## 2016-06-08 MED ORDER — ALUM & MAG HYDROXIDE-SIMETH 200-200-20 MG/5ML PO SUSP: 30.0000 mL | ORAL | Status: DC | PRN

## 2016-06-08 MED ORDER — OXYCODONE HCL 5 MG/5ML PO SOLN: 5.0000 mg | Freq: Once | ORAL | Status: DC | PRN

## 2016-06-08 MED ORDER — CEFAZOLIN SODIUM-DEXTROSE 2-4 GM/100ML-% IV SOLN: 2.0000 g | Freq: Once | INTRAVENOUS | Status: DC

## 2016-06-08 MED ORDER — EPHEDRINE SULFATE 50 MG/ML IJ SOLN
INTRAMUSCULAR | Status: DC | PRN
  Administered 2016-06-08: 10 mg via INTRAVENOUS

## 2016-06-08 MED ORDER — OXYCODONE HCL 5 MG PO TABS: 5.0000 mg | ORAL_TABLET | Freq: Once | ORAL | Status: DC | PRN

## 2016-06-08 MED ORDER — METOCLOPRAMIDE HCL 10 MG PO TABS: 5.0000 mg | ORAL_TABLET | Freq: Three times a day (TID) | ORAL | Status: DC | PRN

## 2016-06-08 MED ORDER — DOCUSATE SODIUM 100 MG PO CAPS
100.0000 mg | ORAL_CAPSULE | Freq: Two times a day (BID) | ORAL | Status: DC
  Administered 2016-06-08 – 2016-06-09 (×3): 100 mg via ORAL
  Filled 2016-06-08 (×4): qty 1

## 2016-06-08 MED ORDER — PHENYLEPHRINE HCL 10 MG/ML IJ SOLN
INTRAMUSCULAR | Status: DC | PRN
  Administered 2016-06-08 (×3): 100 ug via INTRAVENOUS

## 2016-06-08 MED ORDER — MEPERIDINE HCL 25 MG/ML IJ SOLN: 6.2500 mg | INTRAMUSCULAR | Status: DC | PRN

## 2016-06-08 MED ORDER — PHENYLEPHRINE HCL 10 MG/ML IJ SOLN
INTRAMUSCULAR | Status: DC | PRN
  Administered 2016-06-08: 30 ug/min via INTRAVENOUS

## 2016-06-08 MED ORDER — METHOCARBAMOL 1000 MG/10ML IJ SOLN
500.0000 mg | Freq: Four times a day (QID) | INTRAMUSCULAR | Status: DC | PRN
  Filled 2016-06-08: qty 5

## 2016-06-08 MED ORDER — METOCLOPRAMIDE HCL 5 MG/ML IJ SOLN
5.0000 mg | Freq: Three times a day (TID) | INTRAMUSCULAR | Status: DC | PRN
Start: 2016-06-08 — End: 2016-06-10

## 2016-06-08 MED ORDER — FENTANYL CITRATE (PF) 100 MCG/2ML IJ SOLN
INTRAMUSCULAR | Status: DC | PRN
  Administered 2016-06-08: 50 ug via INTRAVENOUS

## 2016-06-08 MED ORDER — ACETAMINOPHEN 650 MG RE SUPP
650.0000 mg | Freq: Four times a day (QID) | RECTAL | Status: DC | PRN
Start: 2016-06-08 — End: 2016-06-10

## 2016-06-08 MED ORDER — TRANEXAMIC ACID 1000 MG/10ML IV SOLN
INTRAVENOUS | Status: AC
  Filled 2016-06-08: qty 10

## 2016-06-08 MED ORDER — FAMOTIDINE 20 MG PO TABS
20.0000 mg | ORAL_TABLET | Freq: Once | ORAL | Status: AC
  Administered 2016-06-08: 20 mg via ORAL

## 2016-06-08 MED ORDER — BISACODYL 10 MG RE SUPP: 10.0000 mg | Freq: Every day | RECTAL | Status: DC | PRN

## 2016-06-08 MED ORDER — FENTANYL CITRATE (PF) 100 MCG/2ML IJ SOLN: 25.0000 ug | INTRAMUSCULAR | Status: DC | PRN

## 2016-06-08 MED ORDER — ONDANSETRON HCL 4 MG PO TABS: 4.0000 mg | ORAL_TABLET | Freq: Four times a day (QID) | ORAL | Status: DC | PRN

## 2016-06-08 MED ORDER — MIDAZOLAM HCL 5 MG/5ML IJ SOLN
INTRAMUSCULAR | Status: DC | PRN
  Administered 2016-06-08: 2 mg via INTRAVENOUS

## 2016-06-08 MED ORDER — CARVEDILOL 3.125 MG PO TABS
3.1250 mg | ORAL_TABLET | Freq: Two times a day (BID) | ORAL | Status: DC
  Administered 2016-06-09: 3.125 mg via ORAL
  Filled 2016-06-08 (×3): qty 1

## 2016-06-08 MED ORDER — ASPIRIN 81 MG PO CHEW
81.0000 mg | CHEWABLE_TABLET | Freq: Two times a day (BID) | ORAL | Status: DC
  Administered 2016-06-08 – 2016-06-10 (×4): 81 mg via ORAL
  Filled 2016-06-08 (×4): qty 1

## 2016-06-08 MED ORDER — PNEUMOCOCCAL VAC POLYVALENT 25 MCG/0.5ML IJ INJ: 0.5000 mL | INJECTION | INTRAMUSCULAR | Status: DC | PRN

## 2016-06-08 MED ORDER — ACETAMINOPHEN 325 MG PO TABS: 650.0000 mg | ORAL_TABLET | Freq: Four times a day (QID) | ORAL | Status: DC | PRN

## 2016-06-08 MED ORDER — MORPHINE SULFATE (PF) 2 MG/ML IV SOLN
2.0000 mg | INTRAVENOUS | Status: DC | PRN
  Administered 2016-06-08: 2 mg via INTRAVENOUS
  Filled 2016-06-08: qty 1

## 2016-06-08 MED ORDER — BUPIVACAINE-EPINEPHRINE 0.25% -1:200000 IJ SOLN
INTRAMUSCULAR | Status: DC | PRN
  Administered 2016-06-08: 30 mL

## 2016-06-08 MED ORDER — CLOPIDOGREL BISULFATE 75 MG PO TABS
75.0000 mg | ORAL_TABLET | Freq: Every day | ORAL | Status: DC
  Administered 2016-06-08 – 2016-06-10 (×3): 75 mg via ORAL
  Filled 2016-06-08 (×3): qty 1

## 2016-06-08 MED ORDER — SODIUM CHLORIDE 0.9 % IV SOLN
INTRAVENOUS | Status: DC
  Administered 2016-06-08: 1000 mL via INTRAVENOUS
  Administered 2016-06-09: 04:00:00 via INTRAVENOUS

## 2016-06-08 MED ORDER — CEFAZOLIN IN D5W 1 GM/50ML IV SOLN
1.0000 g | Freq: Four times a day (QID) | INTRAVENOUS | Status: AC
  Administered 2016-06-08 – 2016-06-09 (×3): 1 g via INTRAVENOUS
  Filled 2016-06-08 (×4): qty 50

## 2016-06-08 MED ORDER — MENTHOL 3 MG MT LOZG
1.0000 | LOZENGE | OROMUCOSAL | Status: DC | PRN
  Filled 2016-06-08: qty 9

## 2016-06-08 MED ORDER — DIPHENHYDRAMINE HCL 12.5 MG/5ML PO ELIX
12.5000 mg | ORAL_SOLUTION | ORAL | Status: DC | PRN
Start: 2016-06-08 — End: 2016-06-10

## 2016-06-08 MED ORDER — ONDANSETRON HCL 4 MG/2ML IJ SOLN: 4.0000 mg | Freq: Four times a day (QID) | INTRAMUSCULAR | Status: DC | PRN

## 2016-06-08 MED ORDER — PROPOFOL 500 MG/50ML IV EMUL
INTRAVENOUS | Status: DC | PRN
  Administered 2016-06-08: 50 ug/kg/min via INTRAVENOUS

## 2016-06-08 MED ORDER — ZOLPIDEM TARTRATE 5 MG PO TABS: 5.0000 mg | ORAL_TABLET | Freq: Every evening | ORAL | Status: DC | PRN

## 2016-06-08 MED ORDER — NEOMYCIN-POLYMYXIN B GU 40-200000 IR SOLN
Status: DC | PRN
  Administered 2016-06-08: 2 mL

## 2016-06-08 MED ORDER — RAMIPRIL 1.25 MG PO CAPS
2.5000 mg | ORAL_CAPSULE | Freq: Every day | ORAL | Status: DC
  Administered 2016-06-08: 2.5 mg via ORAL
  Filled 2016-06-08 (×2): qty 2

## 2016-06-08 MED ORDER — OXYCODONE HCL 5 MG PO TABS
5.0000 mg | ORAL_TABLET | ORAL | Status: DC | PRN
  Administered 2016-06-08 (×2): 5 mg via ORAL
  Administered 2016-06-09 – 2016-06-10 (×5): 10 mg via ORAL
  Filled 2016-06-08: qty 2
  Filled 2016-06-08: qty 1
  Filled 2016-06-08 (×5): qty 2
  Filled 2016-06-08: qty 1

## 2016-06-08 MED ORDER — MAGNESIUM HYDROXIDE 400 MG/5ML PO SUSP: 30.0000 mL | Freq: Every day | ORAL | Status: DC | PRN

## 2016-06-08 MED ORDER — PROMETHAZINE HCL 25 MG/ML IJ SOLN: 6.2500 mg | INTRAMUSCULAR | Status: DC | PRN

## 2016-06-08 MED ORDER — LACTATED RINGERS IV SOLN
INTRAVENOUS | Status: DC
  Administered 2016-06-08: 14:00:00 via INTRAVENOUS

## 2016-06-08 MED ORDER — METHOCARBAMOL 500 MG PO TABS
500.0000 mg | ORAL_TABLET | Freq: Four times a day (QID) | ORAL | Status: DC | PRN
  Administered 2016-06-09: 500 mg via ORAL
  Filled 2016-06-08: qty 1

## 2016-06-08 MED ORDER — GLYCOPYRROLATE 0.2 MG/ML IJ SOLN
INTRAMUSCULAR | Status: DC | PRN
  Administered 2016-06-08: 0.2 mg via INTRAVENOUS

## 2016-06-08 MED ORDER — ONDANSETRON HCL 4 MG/2ML IJ SOLN
INTRAMUSCULAR | Status: DC | PRN
  Administered 2016-06-08: 4 mg via INTRAVENOUS

## 2016-06-08 MED ORDER — ATORVASTATIN CALCIUM 20 MG PO TABS
40.0000 mg | ORAL_TABLET | Freq: Every day | ORAL | Status: DC
  Administered 2016-06-08 – 2016-06-09 (×2): 40 mg via ORAL
  Filled 2016-06-08 (×2): qty 2

## 2016-06-08 SURGICAL SUPPLY — 48 items
BLADE SAW SAG 18.5X105 (BLADE) ×3 IMPLANT
BNDG COHESIVE 6X5 TAN STRL LF (GAUZE/BANDAGES/DRESSINGS) ×6 IMPLANT
CANISTER SUCT 1200ML W/VALVE (MISCELLANEOUS) ×3 IMPLANT
CAPT HIP TOTAL 3 ×3 IMPLANT
CATH FOL LEG HOLDER (MISCELLANEOUS) ×3 IMPLANT
CATH TRAY METER 16FR LF (MISCELLANEOUS) ×3 IMPLANT
CHLORAPREP W/TINT 26ML (MISCELLANEOUS) ×3 IMPLANT
DRAPE C-ARM XRAY 36X54 (DRAPES) ×3 IMPLANT
DRAPE INCISE IOBAN 66X60 STRL (DRAPES) IMPLANT
DRAPE POUCH INSTRU U-SHP 10X18 (DRAPES) ×3 IMPLANT
DRAPE SHEET LG 3/4 BI-LAMINATE (DRAPES) ×9 IMPLANT
DRAPE STERI IOBAN 125X83 (DRAPES) ×3 IMPLANT
DRAPE TABLE BACK 80X90 (DRAPES) ×3 IMPLANT
DRSG OPSITE POSTOP 4X8 (GAUZE/BANDAGES/DRESSINGS) ×6 IMPLANT
ELECT BLADE 6.5 EXT (BLADE) ×3 IMPLANT
ELECT REM PT RETURN 9FT ADLT (ELECTROSURGICAL) ×3
ELECTRODE REM PT RTRN 9FT ADLT (ELECTROSURGICAL) ×1 IMPLANT
GAUZE SPONGE 4X4 12PLY STRL (GAUZE/BANDAGES/DRESSINGS) ×3 IMPLANT
GLOVE BIOGEL PI IND STRL 9 (GLOVE) ×1 IMPLANT
GLOVE BIOGEL PI INDICATOR 9 (GLOVE) ×2
GLOVE SURG SYN 9.0  PF PI (GLOVE) ×4
GLOVE SURG SYN 9.0 PF PI (GLOVE) ×2 IMPLANT
GOWN SRG 2XL LVL 4 RGLN SLV (GOWNS) ×1 IMPLANT
GOWN STRL NON-REIN 2XL LVL4 (GOWNS) ×2
GOWN STRL REUS W/ TWL LRG LVL3 (GOWN DISPOSABLE) ×1 IMPLANT
GOWN STRL REUS W/TWL LRG LVL3 (GOWN DISPOSABLE) ×2
HEMOVAC 400CC 10FR (MISCELLANEOUS) IMPLANT
HOOD PEEL AWAY FLYTE STAYCOOL (MISCELLANEOUS) ×3 IMPLANT
MAT BLUE FLOOR 46X72 FLO (MISCELLANEOUS) ×3 IMPLANT
NDL SAFETY 18GX1.5 (NEEDLE) ×3 IMPLANT
NEEDLE SPNL 18GX3.5 QUINCKE PK (NEEDLE) ×3 IMPLANT
NS IRRIG 1000ML POUR BTL (IV SOLUTION) ×3 IMPLANT
PACK HIP COMPR (MISCELLANEOUS) ×3 IMPLANT
SOL PREP PVP 2OZ (MISCELLANEOUS) ×3
SOLUTION PREP PVP 2OZ (MISCELLANEOUS) ×1 IMPLANT
STAPLER SKIN PROX 35W (STAPLE) ×3 IMPLANT
STRAP SAFETY BODY (MISCELLANEOUS) ×3 IMPLANT
SUT DVC 2 QUILL PDO  T11 36X36 (SUTURE) ×2
SUT DVC 2 QUILL PDO T11 36X36 (SUTURE) ×1 IMPLANT
SUT DVC QUILL MONODERM 30X30 (SUTURE) ×3 IMPLANT
SUT SILK 0 (SUTURE) ×2
SUT SILK 0 30XBRD TIE 6 (SUTURE) ×1 IMPLANT
SUT VIC AB 1 CT1 36 (SUTURE) ×3 IMPLANT
SYR 20CC LL (SYRINGE) ×3 IMPLANT
SYR 30ML LL (SYRINGE) ×3 IMPLANT
TAPE MICROFOAM 4IN (TAPE) ×3 IMPLANT
TOWEL OR 17X26 4PK STRL BLUE (TOWEL DISPOSABLE) ×3 IMPLANT
TUBE KAMVAC SUCTION (TUBING) IMPLANT

## 2016-06-08 NOTE — Progress Notes (Signed)
Pt. C/o pain 10 out of 10. Not time for pain med at this time. Dr. Roland Rack ordered morphine 2-4 mg q2h prn

## 2016-06-08 NOTE — Op Note (Signed)
06/08/2016  4:37 PM  PATIENT:  Bruce Moody  65 y.o. male  PRE-OPERATIVE DIAGNOSIS:  PRIMARY OSTEOARTHRITIS LEFT HIP  POST-OPERATIVE DIAGNOSIS:  PRIMARY OSTEOARTHRITIS LEFT HIP  PROCEDURE:  Procedure(s): TOTAL HIP ARTHROPLASTY ANTERIOR APPROACH (Left)  SURGEON: Laurene Footman, MD  ASSISTANTS: None  ANESTHESIA:   spinal  EBL:  Total I/O In: 800 [I.V.:800] Out: 300 [Urine:100; Blood:200]  BLOOD ADMINISTERED:none  DRAINS: none   LOCAL MEDICATIONS USED:  MARCAINE     SPECIMEN:  Source of Specimen:  Left femoral head  DISPOSITION OF SPECIMEN:  PATHOLOGY  COUNTS:  YES  TOURNIQUET:  * No tourniquets in log *  IMPLANTS: Medacta AMIS 0 standard stem, 52 mm Mpact cup DM with liner and S 28 mm head  DICTATION: .Dragon Dictation   The patient was brought to the operating room and after spinal anesthesia was obtained patient was placed on the operative table with the ipsilateral foot into the Medacta attachment, contralateral leg on a well-padded table. C-arm was brought in and preop template x-ray taken. After prepping and draping in usual sterile fashion appropriate patient identification and timeout procedures were completed. Anterior approach to the hip was obtained and centered over the greater trochanter and TFL muscle. The subcutaneous tissue was incised hemostasis being achieved by electrocautery. TFL fascia was incised and the muscle retracted laterally deep retractor placed. The lateral femoral circumflex vessels were identified and ligated. The anterior capsule was exposed and a capsulotomy performed. The neck was identified and a femoral neck cut carried out with a saw. The head was removed without difficulty and showed sclerotic femoral head and acetabulum. Reaming was carried out to 50 mm and a 52 mm cup trial gave appropriate tightness to the acetabular component a 52 DM cup was impacted into position. The leg was then externally rotated and ischiofemoral and pubofemoral  releases carried out. The femur was sequentially broached to a size 0, size 0 stem and standard neck and S head trials were placed and the final components chosen. The 0 standard stem was inserted along with a S 28 mm head and 52 mm liner. The hip was reduced and was stable the wound was thoroughly irrigated. The deep fascia was closed using a heavy Quill after infiltration of 30 cc of quarter percent Sensorcaine with epinephrine. 3-0 V-loc to close the skin, with skin staples Xeroform and honeycomb dressing applied with TXA injected into the joint prior to skin closure  PLAN OF CARE: Admit to inpatient

## 2016-06-08 NOTE — H&P (Signed)
Reviewed paper H+P, will be scanned into chart. No changes noted.  

## 2016-06-08 NOTE — Transfer of Care (Deleted)
Immediate Anesthesia Transfer of Care Note  Patient: Bruce Moody  Procedure(s) Performed: Procedure(s): TOTAL HIP ARTHROPLASTY ANTERIOR APPROACH (Left)  Patient Location: PACU  Anesthesia Type:Spinal  Level of Consciousness: awake  Airway & Oxygen Therapy: Patient Spontanous Breathing and Patient connected to face mask oxygen  Post-op Assessment: Report given to RN and Post -op Vital signs reviewed and stable  Post vital signs: Reviewed  Last Vitals:  Vitals:   06/08/16 1325 06/08/16 1413  BP: (!) 141/77 (!) 102/48  Pulse: 62 81  Resp: 16 15  Temp: 36.6 C 36.7 C    Last Pain:  Vitals:   06/08/16 1325  TempSrc: Oral         Complications: No apparent anesthesia complications

## 2016-06-08 NOTE — Transfer of Care (Signed)
Immediate Anesthesia Transfer of Care Note  Patient: Bruce Moody  Procedure(s) Performed: Procedure(s): TOTAL HIP ARTHROPLASTY ANTERIOR APPROACH (Left)  Patient Location: PACU  Anesthesia Type:Spinal  Level of Consciousness: awake, alert  and oriented  Airway & Oxygen Therapy: Patient Spontanous Breathing and Patient connected to face mask oxygen  Post-op Assessment: Report given to RN and Post -op Vital signs reviewed and stable  Post vital signs: Reviewed  Last Vitals:  Vitals:   06/08/16 1413 06/08/16 1633  BP: (!) 102/48 93/71  Pulse: 81 82  Resp: 15 18  Temp: 36.7 C 36.7 C    Last Pain:  Vitals:   06/08/16 1325  TempSrc: Oral         Complications: No apparent anesthesia complications

## 2016-06-08 NOTE — Anesthesia Preprocedure Evaluation (Signed)
Anesthesia Evaluation  Patient identified by MRN, date of birth, ID band Patient awake    Reviewed: Allergy & Precautions, NPO status , Patient's Chart, lab work & pertinent test results, reviewed documented beta blocker date and time   History of Anesthesia Complications Negative for: history of anesthetic complications  Airway Mallampati: III  TM Distance: >3 FB Neck ROM: Full    Dental  (+) Poor Dentition   Pulmonary neg sleep apnea, neg COPD, former smoker,    breath sounds clear to auscultation- rhonchi (-) wheezing      Cardiovascular Exercise Tolerance: Good hypertension, Pt. on medications and Pt. on home beta blockers + CAD, + Past MI, + Cardiac Stents, + CABG (1998) and +CHF   Rhythm:Regular Rate:Normal - Systolic murmurs and - Diastolic murmurs Echo 0000000:  MILD LV SYSTOLIC DYSFUNCTION (EF [id number]) NORMAL RIGHT VENTRICULAR SYSTOLIC FUNCTION MILD VALVULAR REGURGITATION (Mild MR, mild TR) NO VALVULAR STENOSIS   Neuro/Psych negative neurological ROS  negative psych ROS   GI/Hepatic negative GI ROS, Neg liver ROS,   Endo/Other  negative endocrine ROSneg diabetes  Renal/GU negative Renal ROS     Musculoskeletal  (+) Arthritis ,   Abdominal (+) - obese,   Peds  Hematology negative hematology ROS (+)   Anesthesia Other Findings Past Medical History: No date: Arthritis 2011: Cancer ([place])     Comment: Melanoma No date: Coronary artery disease No date: Elevated lipids No date: Heart disease No date: Hypertension No date: Myocardial infarction   Reproductive/Obstetrics                             Anesthesia Physical Anesthesia Plan  ASA: III  Anesthesia Plan: Spinal   Post-op Pain Management:    Induction:   Airway Management Planned:   Additional Equipment:   Intra-op Plan:   Post-operative Plan:   Informed Consent: I have reviewed the patients History and  Physical, chart, labs and discussed the procedure including the risks, benefits and alternatives for the proposed anesthesia with the patient or authorized representative who has indicated his/her understanding and acceptance.   Dental advisory given  Plan Discussed with: CRNA and Anesthesiologist  Anesthesia Plan Comments:         Lab Results  Component Value Date   WBC 6.9 05/26/2016   HGB 15.0 05/26/2016   HCT 43.9 05/26/2016   MCV 95.7 05/26/2016   PLT 237 05/26/2016    Anesthesia Quick Evaluation

## 2016-06-08 NOTE — Anesthesia Procedure Notes (Signed)
Spinal  Patient location during procedure: OR Staffing Anesthesiologist: PENWARDEN, AMY Resident/CRNA: LOGAN, BENJAMIN Performed: resident/CRNA  Preanesthetic Checklist Completed: patient identified, site marked, surgical consent, pre-op evaluation, timeout performed, IV checked, risks and benefits discussed and monitors and equipment checked Spinal Block Patient position: sitting Prep: ChloraPrep and site prepped and draped Patient monitoring: heart rate, continuous pulse ox, blood pressure and cardiac monitor Approach: midline Location: L4-5 Injection technique: single-shot Needle Needle type: Whitacre and Introducer  Needle gauge: 24 G Needle length: 9 cm Additional Notes Negative paresthesia. Negative blood return. Positive free-flowing CSF. Expiration date of kit checked and confirmed. Patient tolerated procedure well, without complications.       

## 2016-06-09 ENCOUNTER — Encounter: Payer: Self-pay | Admitting: Orthopedic Surgery

## 2016-06-09 LAB — BASIC METABOLIC PANEL
Anion gap: 4 — ABNORMAL LOW (ref 5–15)
BUN: 18 mg/dL (ref 6–20)
CALCIUM: 7.7 mg/dL — AB (ref 8.9–10.3)
CHLORIDE: 105 mmol/L (ref 101–111)
CO2: 26 mmol/L (ref 22–32)
CREATININE: 0.91 mg/dL (ref 0.61–1.24)
GFR calc non Af Amer: >60 mL/min (ref >60)
GLUCOSE: 126 mg/dL — AB (ref 65–99)
Potassium: 3.9 mmol/L (ref 3.5–5.1)
Sodium: 135 mmol/L (ref 135–145)

## 2016-06-09 LAB — CBC
HEMATOCRIT: 35.6 % — AB (ref 40.0–52.0)
Hemoglobin: 12.4 g/dL — ABNORMAL LOW (ref 13.0–18.0)
MCH: 33.3 pg (ref 26.0–34.0)
MCHC: 34.7 g/dL (ref 32.0–36.0)
MCV: 95.9 fL (ref 80.0–100.0)
Platelets: 221 10*3/uL (ref 150–440)
RBC: 3.71 MIL/uL — ABNORMAL LOW (ref 4.40–5.90)
RDW: 12.9 % (ref 11.5–14.5)
WBC: 8.6 10*3/uL (ref 3.8–10.6)

## 2016-06-09 MED ORDER — KETOROLAC TROMETHAMINE 30 MG/ML IJ SOLN
30.0000 mg | Freq: Once | INTRAMUSCULAR | Status: AC
  Administered 2016-06-09: 30 mg via INTRAVENOUS
  Filled 2016-06-09: qty 1

## 2016-06-09 MED ORDER — KETOROLAC TROMETHAMINE 15 MG/ML IJ SOLN
15.0000 mg | Freq: Four times a day (QID) | INTRAMUSCULAR | Status: DC
  Administered 2016-06-09 – 2016-06-10 (×4): 15 mg via INTRAVENOUS
  Filled 2016-06-09 (×4): qty 1

## 2016-06-09 MED ORDER — OXYCODONE HCL 5 MG PO TABS: 5.0000 mg | ORAL_TABLET | ORAL | 0 refills | Status: DC | PRN

## 2016-06-09 NOTE — Progress Notes (Signed)
   Subjective: 1 Day Post-Op Procedure(s) (LRB): TOTAL HIP ARTHROPLASTY ANTERIOR APPROACH (Left) Patient reports pain as mild.   Patient is well, and has had no acute complaints or problems.  Denies any CP, SOB, ABD pain. No lightheadedness.  We will continue therapy today.  Plan is to go Home after hospital stay.  Objective: Vital signs in last 24 hours: Temp:  [97.3 F (36.3 C)-98.9 F (37.2 C)] 98.9 F (37.2 C) (11/22 0733) Pulse Rate:  [59-88] 59 (11/22 0733) Resp:  [14-19] 18 (11/22 0733) BP: (80-141)/(48-77) 86/58 (11/22 0802) SpO2:  [96 %-100 %] 98 % (11/22 0733) Weight:  [63 kg (139 lb)] 63 kg (139 lb) (11/21 1325)  Intake/Output from previous day: 11/21 0701 - 11/22 0700 In: 2011.7 [I.V.:1911.7; IV Piggyback:100] Out: 1010 [Urine:810; Blood:200] Intake/Output this shift: No intake/output data recorded.   Recent Labs  06/09/16 0453  HGB 12.4*    Recent Labs  06/09/16 0453  WBC 8.6  RBC 3.71*  HCT 35.6*  PLT 221    Recent Labs  06/09/16 0453  NA 135  K 3.9  CL 105  CO2 26  BUN 18  CREATININE 0.91  GLUCOSE 126*  CALCIUM 7.7*   No results for input(s): LABPT, INR in the last 72 hours.  EXAM General - Patient is Alert, Appropriate and Oriented Extremity - Neurovascular intact Sensation intact distally Intact pulses distally Dorsiflexion/Plantar flexion intact No cellulitis present Compartment soft Dressing - dressing C/D/I and no drainage Motor Function - intact, moving foot and toes well on exam.   Past Medical History:  Diagnosis Date  . Arthritis   . Cancer [facility]) 2011   Melanoma  . Coronary artery disease   . Elevated lipids   . Heart disease   . Hypertension   . Myocardial infarction     Assessment/Plan:   1 Day Post-Op Procedure(s) (LRB): TOTAL HIP ARTHROPLASTY ANTERIOR APPROACH (Left) Active Problems:   Status post total hip replacement, left  Estimated body mass index is 24.62 kg/m as calculated from the following:  Height as of this encounter: 5\' 3"  (1.6 m).   Weight as of this encounter: 63 kg (139 lb). Advance diet Up with therapy  Needs BM Acute post op blood loss anemia - Hgb 12.4, recheck labs in the am Hypotension - will hold BP Meds CM To assist with discharge  DVT ProphylaxisplavixAspirin and plavix Weight-Bearing as tolerated to left leg   T. Rachelle Hora, PA-C [place] 06/09/2016, 8:05 AM

## 2016-06-09 NOTE — Care Management Important Message (Signed)
Important Message  Patient Details  Name: Bruce Moody MRN: [id number] Date of Birth: March 23, 1951   Medicare Important Message Given:  Yes    Jolly Mango, RN 06/09/2016, 11:36 AM

## 2016-06-09 NOTE — Progress Notes (Signed)
Pain medication prescription given to patient's wife Butch Penny today due to patient's pharmacy not being open tomorrow. Patient's wife wants to get all prescriptions filled on today.

## 2016-06-09 NOTE — Progress Notes (Signed)
Physical Therapy Treatment Patient Details Name: Bruce Moody MRN: [id number] DOB: 1951-01-19 Today's Date: 06/09/2016    History of Present Illness Pt is 65 year old male s/p L THA Anterior by Dr Rudene Christians 06-08-16 and is WBAT.  He was having periods of low BP which has been monitored closely.     PT Comments    Pt fatigued, but agreeable to PT. Minimal pain complaints in left hip (1/10). Reviewed and performed basic repetitions for supine exercises; pt feels he has a good understanding of exercises and can perform independently. BP checked in sit and stand with readings of 105/57 and 97/53 respectively; pt asymptomatic. Pt notes his blood pressure runs on the low side, but states "not that low". Although considered practicing steps this afternoon; stair climbing held at this time due to low BP and fatigue. Pt performs bed mobility and transfers well. Progressed ambulation well to over 200 ft with chair follow for safety; did not require use of seated rest. Pt wished return to bed to take a nap post session. Continue PT to progress strength, endurance and stairs to allow for an optimal, safe return home.   Follow Up Recommendations  Home health PT     Equipment Recommendations  None recommended by PT    Recommendations for Other Services       Precautions / Restrictions Precautions Precautions: Anterior Hip Precaution Booklet Issued: Yes (comment) Restrictions Weight Bearing Restrictions: Yes LLE Weight Bearing: Weight bearing as tolerated    Mobility  Bed Mobility Overal bed mobility: Modified Independent             General bed mobility comments: No issues, use of rail and mild increased time to descend legs to and over edge of bed  Transfers Overall transfer level: Needs assistance Equipment used: Rolling walker (2 wheeled) Transfers: Sit to/from Stand Sit to Stand: Supervision         General transfer comment: Good use of hands, Mild orthostatic BP with stand  94/53  Ambulation/Gait Ambulation/Gait assistance: Min guard Ambulation Distance (Feet): 260 Feet Assistive device: Rolling walker (2 wheeled) Gait Pattern/deviations: Step-through pattern;WFL(Within Functional Limits) Gait velocity: reduced Gait velocity interpretation: Below normal speed for age/gender General Gait Details: Chair follow for safety due to low BP (asymptomatic). Good fluidity, weight shift and stride. Pt subjectively notes mild fear of weightbearing on L, but only requires light use of rw   Stairs Stairs:  (deferred)          Wheelchair Mobility    Modified Rankin (Stroke Patients Only)       Balance Overall balance assessment: Needs assistance Sitting-balance support: Feet supported Sitting balance-Leahy Scale: Normal     Standing balance support: Bilateral upper extremity supported Standing balance-Leahy Scale: Good Standing balance comment: Balance training with feet apart, together, semi-tandem, and tandem with combinations of eyes open/close, head still/head turns                    Cognition Arousal/Alertness: Awake/alert Behavior During Therapy: WFL for tasks assessed/performed Overall Cognitive Status: Within Functional Limits for tasks assessed                      Exercises Total Joint Exercises Ankle Circles/Pumps: AROM;Both;10 reps;Supine Quad Sets: Strengthening;Both;10 reps;Supine Gluteal Sets: Strengthening;Both;10 reps;Supine Heel Slides: AAROM;Left;10 reps;Supine (self assist) Hip ABduction/ADduction: AAROM;Left;10 reps;Supine Straight Leg Raises: AAROM;Left;10 reps;Supine Long Arc Quad: AROM;Left;10 reps;Seated Knee Flexion: AROM;Both;10 reps;15 reps Marching in Standing: AROM;Both;10 reps;15 reps Other Exercises Other  Exercises: stand wt shift pre gait Other Exercises: HEP training with BLE APs, GS, and QS x 10-15 reps, 3-5x/day    General Comments        Pertinent Vitals/Pain Pain Assessment: 0-10 Pain  Score: 1  Pain Location: L hip Pain Descriptors / Indicators: Aching Pain Intervention(s): Monitored during session;Premedicated before session    Home Living Family/patient expects to be discharged to:: Private residence Living Arrangements: Spouse/significant other Available Help at Discharge: Family;Available 24 hours/day Type of Home: House Home Access: Stairs to enter Entrance Stairs-Rails: Can reach both;Left;Right Home Layout: One level Home Equipment: Walker - 2 wheels;Cane - single point Additional Comments: rec BSC or raised toilet seat at home    Prior Function Level of Independence: Independent      Comments: Ind amb with amb community distances, ind with ADLs, no fall history   PT Goals (current goals can now be found in the care plan section) Acute Rehab PT Goals Patient Stated Goal: To walk better with better endurance. Time For Goal Achievement: 06/22/16 Potential to Achieve Goals: Good Progress towards PT goals: Progressing toward goals    Frequency    BID      PT Plan Current plan remains appropriate    Co-evaluation             End of Session Equipment Utilized During Treatment: Gait belt Activity Tolerance: Patient tolerated treatment well Patient left: in bed;with call bell/phone within reach;with bed alarm set     Time: [id number] PT Time Calculation (min) (ACUTE ONLY): 23 min  Charges:  $Gait Training: 8-22 mins $Therapeutic Exercise: 8-22 mins                    G Codes:      Larae Grooms, PTA 06/09/2016, 2:50 PM

## 2016-06-09 NOTE — Evaluation (Signed)
Physical Therapy Evaluation Patient Details Name: Bruce Moody MRN: [id number] DOB: 1951/07/08 Today's Date: 06/09/2016   History of Present Illness  Pt is 65 year old male s/p L THA Anterior by Dr Rudene Christians 06-08-16 and is WBAT.  He was having periods of low BP which has been monitored closely.   Clinical Impression  Pt presents with deficits in strength, transfers, mobility, gait, balance, and activity tolerance.  Pt was SBA/Mod I with bed mobility with min verbal cues for sequencing to maintain hip precautions.  Pt CGA with transfers with min verbal cues for sequencing.  Pt SBA with gait with RW progressing form step-to gait x 20' to reciprocal gait x 60' with slow cadence and min verbal cues for sequencing.  Pt's BP at rest 112/65 mmHg and 103/61 mmHg after amb without symptoms, HR and SpO2 WNL throughout. Pt will benefit from PT services to address above deficits for decreased caregiver assistance upon discharge.       Follow Up Recommendations Home health PT    Equipment Recommendations  None recommended by PT    Recommendations for Other Services       Precautions / Restrictions Precautions Precautions: Anterior Hip Precaution Booklet Issued: Yes (comment) Restrictions Weight Bearing Restrictions: Yes LLE Weight Bearing: Weight bearing as tolerated      Mobility  Bed Mobility Overal bed mobility: Modified Independent             General bed mobility comments: Min-mod verbal cues for safe sequencing to maintain hip precautions with use of rails  Transfers Overall transfer level: Needs assistance Equipment used: Rolling walker (2 wheeled) Transfers: Sit to/from Stand Sit to Stand: Min guard         General transfer comment: Min verbal cues for proper hand placement  Ambulation/Gait Ambulation/Gait assistance: Supervision Ambulation Distance (Feet): 60 Feet Assistive device: Rolling walker (2 wheeled) Gait Pattern/deviations: Step-to pattern;Step-through  pattern;Decreased step length - left;Decreased step length - right   Gait velocity interpretation: Below normal speed for age/gender General Gait Details: Step-to pattern progessing slowly to step-through  Stairs Stairs:  (deferred)          Wheelchair Mobility    Modified Rankin (Stroke Patients Only)       Balance Overall balance assessment: Needs assistance Sitting-balance support: No upper extremity supported Sitting balance-Leahy Scale: Normal     Standing balance support: No upper extremity supported Standing balance-Leahy Scale: Good Standing balance comment: Balance training with feet apart, together, semi-tandem, and tandem with combinations of eyes open/close, head still/head turns                             Pertinent Vitals/Pain Pain Assessment: 0-10 Pain Score: 4  Pain Location: L hip Pain Descriptors / Indicators: Aching Pain Intervention(s): Premedicated before session;Monitored during session    Home Living Family/patient expects to be discharged to:: Private residence Living Arrangements: Spouse/significant other Available Help at Discharge: Family;Available 24 hours/day Type of Home: House Home Access: Stairs to enter Entrance Stairs-Rails: Can reach both;Left;Right Entrance Stairs-Number of Steps: 2 (2 steps with B rails then one step without rails) Home Layout: One level Home Equipment: Walker - 2 wheels;Cane - single point Additional Comments: rec BSC or raised toilet seat at home    Prior Function Level of Independence: Independent         Comments: Ind amb with amb community distances, ind with ADLs, no fall history     Hand Dominance  Dominant Hand: Right    Extremity/Trunk Assessment   Upper Extremity Assessment: Overall WFL for tasks assessed           Lower Extremity Assessment: Generalized weakness         Communication   Communication: No difficulties  Cognition Arousal/Alertness:  Awake/alert Behavior During Therapy: WFL for tasks assessed/performed Overall Cognitive Status: Within Functional Limits for tasks assessed                      General Comments      Exercises Total Joint Exercises Ankle Circles/Pumps: AROM;10 reps;15 reps;Both Quad Sets: AROM;Both;10 reps;15 reps Gluteal Sets: AROM;Both;10 reps Long Arc Quad: AROM;Both;10 reps;15 reps Knee Flexion: AROM;Both;10 reps;15 reps Marching in Standing: AROM;Both;10 reps;15 reps Other Exercises Other Exercises: Static standing balance training per above Other Exercises: HEP training with BLE APs, GS, and QS x 10-15 reps, 3-5x/day   Assessment/Plan    PT Assessment Patient needs continued PT services  PT Problem List Decreased strength;Decreased activity tolerance;Decreased knowledge of use of DME          PT Treatment Interventions DME instruction;Gait training;Stair training;Functional mobility training;Therapeutic activities;Therapeutic exercise;Neuromuscular re-education;Balance training;Patient/family education    PT Goals (Current goals can be found in the Care Plan section)  Acute Rehab PT Goals Patient Stated Goal: To walk better with better endurance. Time For Goal Achievement: 06/22/16 Potential to Achieve Goals: Good    Frequency BID   Barriers to discharge        Co-evaluation               End of Session Equipment Utilized During Treatment: Gait belt Activity Tolerance: Patient tolerated treatment well Patient left: in chair;with call bell/phone within reach;with family/visitor present (OT entered room at end of session for OT eval) Nurse Communication: Mobility status;Other (comment) (Educated nursing that pt would be ok to amb independently with RW except for concerns over earlier low BP)         Time: [id number] PT Time Calculation (min) (ACUTE ONLY): 48 min   Charges:   PT Evaluation $PT Eval Low Complexity: 1 Procedure PT Treatments $Gait Training:  8-22 mins $Therapeutic Exercise: 8-22 mins   PT G Codes:        DRoyetta Asal PT, DPT 06/09/16, 12:48 PM

## 2016-06-09 NOTE — Evaluation (Signed)
Occupational Therapy Evaluation Patient Details Name: Bruce Moody MRN: [id number] DOB: [birth year] Today's Date: 06/09/2016    History of Present Illness Pt is 65 year old male s/p L THA Anterior by Dr Rudene Christians 06-08-16 and is WBAT.  He was having periods of low BP which has been monitored closely.    Clinical Impression    Pt. Is a 65 y.o. Male who was admitted to [facility] for a L THA by Dr Rudene Christians on 06-08-16 and is WBAT.  Patient presents with pain, limited ROM, and impaired functional mobility for ADLs. Patient could benefit from skilled OT services for ADL and A/E retraining, and functional mobility for ADLs in order to improve overall ADL functioning, and return to PLOF.  Rec OT HH to assess for set up in bathroom to ensure safety with bathing when cleared by Dr Rudene Christians.      Follow Up Recommendations  Home health OT    Equipment Recommendations  Toilet riser    Recommendations for Other Services       Precautions / Restrictions Precautions Precautions: Anterior Hip;Fall Restrictions Weight Bearing Restrictions: Yes LLE Weight Bearing: Weight bearing as tolerated      Mobility Bed Mobility                  Transfers                      Balance                                            ADL Overall ADL's : Needs assistance/impaired Eating/Feeding: Independent;Set up   Grooming: Wash/dry hands;Wash/dry face;Oral care;Applying deodorant;Brushing hair;Set up;Independent           Upper Body Dressing : Independent;Set up   Lower Body Dressing: Set up;Minimal assistance;With adaptive equipment Lower Body Dressing Details (indicate cue type and reason): min assist to learn how to use sock aid for LB dressing but supervised for using reacher and when standing and min assist for over hips.               Functional mobility during ADLs: Supervision/safety;Rolling walker       Vision     Perception     Praxis       Pertinent Vitals/Pain Pain Assessment: 0-10 Pain Score: 2  Pain Location: L hip Pain Descriptors / Indicators: Aching Pain Intervention(s): Monitored during session;Premedicated before session;Repositioned     Hand Dominance Right   Extremity/Trunk Assessment Upper Extremity Assessment Upper Extremity Assessment: Overall WFL for tasks assessed   Lower Extremity Assessment Lower Extremity Assessment: Defer to PT evaluation       Communication Communication Communication: No difficulties   Cognition Arousal/Alertness: Awake/alert Behavior During Therapy: WFL for tasks assessed/performed Overall Cognitive Status: Within Functional Limits for tasks assessed                     General Comments       Exercises       Shoulder Instructions      Home Living Family/patient expects to be discharged to:: Private residence Living Arrangements: Spouse/significant other;Children Available Help at Discharge: Family Type of Home: House Home Access: Stairs to enter Technical brewer of Steps: 3   Home Layout: One level     Bathroom Shower/Tub: Tub/shower unit Shower/tub characteristics: Curtain Biochemist, clinical: Associate Professor  Accessibility: Yes   Home Equipment: Walker - 2 wheels;Cane - single point   Additional Comments: rec BSC or raised toilet seat at home      Prior Functioning/Environment Level of Independence: Independent        Comments: Pt was retired and pain in hip increased with standing or whem walking longer distances and used SPC or FWW as needed.        OT Problem List: Pain;Decreased knowledge of use of DME or AE;Decreased range of motion;Decreased strength   OT Treatment/Interventions: Self-care/ADL training;Patient/family education;DME and/or AE instruction    OT Goals(Current goals can be found in the care plan section) Acute Rehab OT Goals Patient Stated Goal: "to hopefully go home tomorrow" OT Goal Formulation: With  patient/family Time For Goal Achievement: 06/23/16 Potential to Achieve Goals: Good ADL Goals Pt Will Perform Lower Body Dressing: with supervision;with adaptive equipment;sit to/from stand (with or without AD sitting using FWW to stand) Pt Will Transfer to Toilet: with supervision;stand pivot transfer;regular height toilet  OT Frequency: Min 1X/week   Barriers to D/C:            Co-evaluation              End of Session Equipment Utilized During Treatment: Gait belt Nurse Communication: Mobility status (pt sitting up in regular chair with alarm in place and Belinda CNA updated)  Activity Tolerance: Patient tolerated treatment well Patient left: in chair;with chair alarm set;with call bell/phone within reach;with family/visitor present   Time: 1135-1210 OT Time Calculation (min): 35 min Charges:  OT General Charges $OT Visit: 1 Procedure OT Evaluation $OT Eval Low Complexity: 1 Procedure OT Treatments $Self Care/Home Management : 8-22 mins G-Codes:    Chrys Racer, OTR/L ascom [phone] 06/09/16, 12:25 PM

## 2016-06-09 NOTE — Anesthesia Postprocedure Evaluation (Signed)
Anesthesia Post Note  Patient: Bruce Moody  Procedure(s) Performed: Procedure(s) (LRB): TOTAL HIP ARTHROPLASTY ANTERIOR APPROACH (Left)  Patient location during evaluation: PACU Anesthesia Type: General Level of consciousness: awake and alert Pain management: pain level controlled Vital Signs Assessment: post-procedure vital signs reviewed and stable Respiratory status: spontaneous breathing, nonlabored ventilation, respiratory function stable and patient connected to nasal cannula oxygen Cardiovascular status: blood pressure returned to baseline and stable Postop Assessment: no signs of nausea or vomiting Anesthetic complications: no    Last Vitals:  Vitals:   06/09/16 0436 06/09/16 0443  BP: (!) 86/50 (!) 102/52  Pulse:    Resp:    Temp:      Last Pain:  Vitals:   06/09/16 0503  TempSrc:   PainSc: [place]

## 2016-06-09 NOTE — Progress Notes (Signed)
toradol 30mg  x1 and toradol 15mg  for 6 doses ordered.

## 2016-06-09 NOTE — Progress Notes (Signed)
Foley d/c'd at [facility]

## 2016-06-09 NOTE — Care Management Note (Addendum)
Case Management Note  Patient Details  Name: ASHLEY MONTMINY MRN: [id number] Date of Birth: 04-25-1951  Subjective/Objective:  Met with patient and Dr. Rudene Christians at bedside. Patient lives at home with his wife and son. Prior to admission he was independent, active and required no DME. He has a walker. He is on plavix so he will not go home on :Lovenox. Discussed HH PT. Wife prefers [facility].  PCP is Lelon Huh.                  Action/Plan: Referral called to [facility] for [facility] PT. It is anticipated that patient will discharge tomorrow.   Expected Discharge Date:   11/23/217               Expected Discharge Plan:  [place]  In-House Referral:     Discharge planning Services  CM Consult  Post Acute Care Choice:  Home Health Choice offered to:  Patient  DME Arranged:    DME Agency:     HH Arranged:  PT [place]:  [place]  Status of Service:  In process, will continue to follow  If discussed at Long Length of Stay Meetings, dates discussed:    Additional Comments:  Jolly Mango, RN 06/09/2016, 12:38 PM

## 2016-06-09 NOTE — Progress Notes (Signed)
Clinical Social Worker (CSW) received SNF consult. PT is recommending home health. RN case manager is aware of above. Please reconsult if future social work needs arise. CSW signing off.   Rital Cavey, LCSW [phone] 

## 2016-06-10 LAB — BASIC METABOLIC PANEL
Anion gap: 4 — ABNORMAL LOW (ref 5–15)
BUN: 13 mg/dL (ref 6–20)
CALCIUM: 8 mg/dL — AB (ref 8.9–10.3)
CHLORIDE: 108 mmol/L (ref 101–111)
CO2: 25 mmol/L (ref 22–32)
CREATININE: 0.8 mg/dL (ref 0.61–1.24)
GFR calc Af Amer: >60 mL/min (ref >60)
GFR calc non Af Amer: >60 mL/min (ref >60)
Glucose, Bld: 111 mg/dL — ABNORMAL HIGH (ref 65–99)
Potassium: 3.6 mmol/L (ref 3.5–5.1)
SODIUM: 137 mmol/L (ref 135–145)

## 2016-06-10 LAB — CBC
HCT: 34.3 % — ABNORMAL LOW (ref 40.0–52.0)
HEMOGLOBIN: 11.9 g/dL — AB (ref 13.0–18.0)
MCH: 33.9 pg (ref 26.0–34.0)
MCHC: 34.7 g/dL (ref 32.0–36.0)
MCV: 97.8 fL (ref 80.0–100.0)
Platelets: 210 10*3/uL (ref 150–440)
RBC: 3.51 MIL/uL — ABNORMAL LOW (ref 4.40–5.90)
RDW: 13.2 % (ref 11.5–14.5)
WBC: 8.9 10*3/uL (ref 3.8–10.6)

## 2016-06-10 NOTE — Progress Notes (Signed)
Subjective: 2 Days Post-Op Procedure(s) (LRB): TOTAL HIP ARTHROPLASTY ANTERIOR APPROACH (Left) Patient reports pain as mild.   Patient is well, and has had no acute complaints or problems.  Denies any CP, SOB, ABD pain. No lightheadedness.  We will continue therapy today.  Plan is to go Home after hospital stay.  Objective: Vital signs in last 24 hours: Temp:  [98.3 F (36.8 C)-99.6 F (37.6 C)] 98.3 F (36.8 C) (11/23 0753) Pulse Rate:  [61-72] 61 (11/23 0753) Resp:  [18-19] 18 (11/23 0753) BP: (87-112)/(40-65) 107/63 (11/23 0753) SpO2:  [96 %-99 %] 99 % (11/23 0753)  Intake/Output from previous day: 11/22 0701 - 11/23 0700 In: 1480 [P.O.:680; I.V.:800] Out: 1000 [Urine:1000] Intake/Output this shift: No intake/output data recorded.   Recent Labs  06/09/16 0453 06/10/16 0623  HGB 12.4* 11.9*    Recent Labs  06/09/16 0453 06/10/16 0623  WBC 8.6 8.9  RBC 3.71* 3.51*  HCT 35.6* 34.3*  PLT 221 210    Recent Labs  06/09/16 0453 06/10/16 0623  NA 135 137  K 3.9 3.6  CL 105 108  CO2 26 25  BUN 18 13  CREATININE 0.91 0.80  GLUCOSE 126* 111*  CALCIUM 7.7* 8.0*   No results for input(s): LABPT, INR in the last 72 hours.  EXAM General - Patient is Alert, Appropriate and Oriented Extremity - Neurovascular intact Sensation intact distally Intact pulses distally Dorsiflexion/Plantar flexion intact No cellulitis present Compartment soft Dressing - dressing C/D/I and no drainage Motor Function - intact, moving foot and toes well on exam.   Past Medical History:  Diagnosis Date  . Arthritis   . Cancer [facility]) 2011   Melanoma  . Coronary artery disease   . Elevated lipids   . Heart disease   . Hypertension   . Myocardial infarction     Assessment/Plan:   2 Days Post-Op Procedure(s) (LRB): TOTAL HIP ARTHROPLASTY ANTERIOR APPROACH (Left) Active Problems:   Status post total hip replacement, left  Estimated body mass index is 24.62 kg/m as  calculated from the following:   Height as of this encounter: 5\' 3"  (1.6 m).   Weight as of this encounter: 63 kg (139 lb). [facility] diet Up with therapy   Labs reviewed, Hg 11.9 this AM. Pt has had a BM. BP 107/63 improved from yesterday. Plan will be for discharge today with HHPT. Continue Plavix and ASA for DVT prophylaxis.  DVT Prophylaxis: Plavix and Aspirin Weight-Bearing as tolerated to left leg  J. Cameron Proud, PA-C [place] 06/10/2016, 9:04 AM

## 2016-06-10 NOTE — Discharge Instructions (Signed)
Diet: As you were doing prior to hospitalization   Shower:  May shower but keep the wounds dry, use an occlusive plastic wrap, NO SOAKING IN TUB.  If the bandage gets wet, change with a clean dry gauze.  Dressing:  You may change your dressing as needed. Change the dressing with sterile gauze dressing.    Activity:  Increase activity slowly as tolerated, but follow the weight bearing instructions below.  No lifting or driving for 6 weeks.  Weight Bearing:   Weight bearing as tolerated to left lower extremity  To prevent constipation: you may use a stool softener such as -  Colace (over the counter) 100 mg by mouth twice a day  Drink plenty of fluids (prune juice may be helpful) and high fiber foods Miralax (over the counter) for constipation as needed.    Itching:  If you experience itching with your medications, try taking only a single pain pill, or even half a pain pill at a time.  You may take up to 10 pain pills per day, and you can also use benadryl over the counter for itching or also to help with sleep.   Precautions:  If you experience chest pain or shortness of breath - call 911 immediately for transfer to the hospital emergency department!!  If you develop a fever greater that 101 F, purulent drainage from wound, increased redness or drainage from wound, or calf pain-Call [employer].                                               Follow- Up Appointment:  Please call for an appointment to be seen in 2 weeks at [facility]  Continue Plavix and and ASA 81mg  for DVT prophylaxis.

## 2016-06-10 NOTE — Progress Notes (Signed)
Discharge instructions discussed with patient and spouse at bedside. Discharge papers given. IV removed. Dressing changed.

## 2016-06-10 NOTE — Progress Notes (Signed)
Physical Therapy Treatment Patient Details Name: Bruce Moody MRN: [id number] DOB: [birth year] Today's Date: 06/10/2016    History of Present Illness Pt is 65 year old male s/p L THA Anterior by Dr Rudene Christians 06-08-16 and is WBAT.  He was having periods of low BP which has been monitored closely.     PT Comments    Patient progressing well towards all established acute care mobility goals. He is able to ambulate around RN station with RW, ascend and descend 4 steps with railing, and complete higher level strengthening exercises in standing with HHA. Patient reports minimal pain with ambulation, appropriate weightbearing. He has met all goals for discharge home when medically appropriate.   Follow Up Recommendations  Home health PT     Equipment Recommendations  None recommended by PT    Recommendations for Other Services       Precautions / Restrictions Precautions Precautions: Anterior Hip Restrictions Weight Bearing Restrictions: Yes LLE Weight Bearing: Weight bearing as tolerated    Mobility  Bed Mobility Overal bed mobility: Independent             General bed mobility comments: No deficits in bed mobility noted.   Transfers Overall transfer level: Needs assistance Equipment used: Rolling walker (2 wheeled) Transfers: Sit to/from Stand Sit to Stand: Modified independent (Device/Increase time)         General transfer comment: No demonstration of orthostatics, proper technique utilized.   Ambulation/Gait Ambulation/Gait assistance: Supervision Ambulation Distance (Feet): 250 Feet Assistive device: Rolling walker (2 wheeled) Gait Pattern/deviations: WFL(Within Functional Limits)   Gait velocity interpretation: Below normal speed for age/gender General Gait Details: No weaving or demonstration of weight shifting through gait. No buckling noted. Gait appears to be progressing well at this point.    Stairs Stairs: Yes Stairs assistance: Modified independent  (Device/Increase time) Stair Management: Two rails;Step to pattern Number of Stairs: 4 General stair comments: Appropriate step to technique with no balance concerns demonstrated.   Wheelchair Mobility    Modified Rankin (Stroke Patients Only)       Balance Overall balance assessment: Needs assistance Sitting-balance support: Feet supported Sitting balance-Leahy Scale: Normal     Standing balance support: Bilateral upper extremity supported Standing balance-Leahy Scale: Good Standing balance comment: Able to balance on single leg with min A from his UEs through RW.                     Cognition Arousal/Alertness: Awake/alert Behavior During Therapy: WFL for tasks assessed/performed Overall Cognitive Status: Within Functional Limits for tasks assessed                      Exercises Total Joint Exercises Ankle Circles/Pumps: AROM;Both;10 reps;Supine Gluteal Sets: Both;10 reps;Supine Heel Slides: 10 reps;AROM;Supine;Both Hip ABduction/ADduction: AROM;10 reps;Both Straight Leg Raises: AROM;Both;10 reps Long Arc Quad: AROM;Both;10 reps Other Exercises Other Exercises: SLS with min A from hands for 5" x 3 per side. Standing hip abductions x 3 per side with HHA, mini squats x 5 with HHA.     General Comments        Pertinent Vitals/Pain Pain Assessment: No/denies pain    Home Living                      Prior Function            PT Goals (current goals can now be found in the care plan section) Progress towards PT goals: Progressing toward goals  Frequency    BID      PT Plan Current plan remains appropriate    Co-evaluation             End of Session Equipment Utilized During Treatment: Gait belt Activity Tolerance: Patient tolerated treatment well Patient left: with call bell/phone within reach;in chair;with family/visitor present     Time: 9470-7615 PT Time Calculation (min) (ACUTE ONLY): 23 min  Charges:  $Gait  Training: 8-22 mins $Therapeutic Exercise: 8-22 mins                    G Codes:      Kerman Passey, PT, DPT    06/10/2016, 12:47 PM

## 2016-06-10 NOTE — Discharge Summary (Signed)
Physician Discharge Summary  Patient ID: Bruce Moody MRN: [id number] DOB/AGE: 1951-04-19 65 y.o.  Admit date: 06/08/2016 Discharge date: 06/10/2016  Admission Diagnoses:  PRIMARY OSTEOARTHRITIS LEFT HIP Primary osteoarthritis of left hip.  Discharge Diagnoses: Patient Active Problem List   Diagnosis Date Noted  . Status post total hip replacement, left 06/08/2016  . CA in situ 12/29/2015  . Congestive heart failure ([place]) 12/29/2015  . Diverticulitis 12/29/2015  . ED (erectile dysfunction) of organic origin 12/29/2015  . Calculus of kidney 12/29/2015  . Benign essential HTN 04/02/2014  . 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 Presence of coronary angioplasty implant and graft 11/20/2013  . Right inguinal hernia 06/28/2013  Primary osteoarthritis of the left hip  Past Medical History:  Diagnosis Date  . Arthritis   . Cancer [facility]) 2011   Melanoma  . Coronary artery disease   . Elevated lipids   . Heart disease   . Hypertension   . Myocardial infarction     Transfusion: None   Consultants (if any):   Discharged Condition: Improved  Hospital Course: Bruce Moody is an 65 y.o. male who was admitted 06/08/2016 with a diagnosis of primary osteoarthritis of the left hip and went to the operating room on 06/08/2016 and underwent the above named procedures.    Surgeries: Procedure(s): TOTAL HIP ARTHROPLASTY ANTERIOR APPROACH on 06/08/2016 Patient tolerated the surgery well. Taken to PACU where she was stabilized and then transferred to the orthopedic floor.  Continued on Plavix 75mg  daily. Foot pumps applied bilaterally at 80 mm. Heels elevated on bed with rolled towels. No evidence of DVT. Negative [place]. Physical therapy started on day #1 for gait training and transfer. OT started day #1 for ADL and assisted devices.  Patient's IV and Foley were  removed on POD1.  Implants: Medacta AMIS 0 standard stem, 52 mm Mpact cup DM with liner and S 28 mm head.  He was given perioperative antibiotics:  Anti-infectives    Start     Dose/Rate Route Frequency Ordered Stop   06/08/16 1900  ceFAZolin (ANCEF) IVPB 1 g/50 mL premix     1 g 100 mL/hr over 30 Minutes Intravenous Every 6 hours 06/08/16 1807 06/09/16 0533   06/08/16 0345  ceFAZolin (ANCEF) IVPB 2g/100 mL premix  Status:  Discontinued     2 g 200 mL/hr over 30 Minutes Intravenous  Once 06/08/16 0337 06/08/16 1803    . He was given sequential compression devices, early ambulation, and Plavix for DVT prophylaxis.  He benefited maximally from the hospital stay and there were no complications.    Recent vital signs:  Vitals:   06/10/16 0354 06/10/16 0753  BP: 104/61 107/63  Pulse: 70 61  Resp: 18 18  Temp: 98.9 F (37.2 C) 98.3 F (36.8 C)    Recent laboratory studies:  Lab Results  Component Value Date   HGB 11.9 (L) 06/10/2016   HGB 12.4 (L) 06/09/2016   HGB 15.0 05/26/2016   Lab Results  Component Value Date   WBC 8.9 06/10/2016   PLT 210 06/10/2016   Lab Results  Component Value Date   INR 0.93 05/26/2016   Lab Results  Component Value Date   NA 137 06/10/2016   K 3.6 06/10/2016   CL 108 06/10/2016   CO2 25 06/10/2016   BUN 13 06/10/2016   CREATININE 0.80 06/10/2016   GLUCOSE 111 (H)  06/10/2016    Discharge Medications:     Medication List    TAKE these medications   acetaminophen 500 MG tablet Commonly known as:  TYLENOL Take 1,000 mg by mouth every 6 (six) hours as needed (for pain.).   aspirin EC 81 MG tablet Take 81 mg by mouth daily.   atorvastatin 40 MG tablet Commonly known as:  LIPITOR Take 40 mg by mouth at bedtime.   carvedilol 3.125 MG tablet Commonly known as:  COREG Take 3.125 mg by mouth 2 (two) times daily with a meal.   clopidogrel 75 MG tablet Commonly known as:  PLAVIX Take 75 mg by mouth daily.   ibuprofen 200 MG  tablet Commonly known as:  ADVIL,MOTRIN Take 200 mg by mouth every 8 (eight) hours as needed (for pain.).   oxyCODONE 5 MG immediate release tablet Commonly known as:  Oxy IR/ROXICODONE Take 1-2 tablets (5-10 mg total) by mouth every 4 (four) hours as needed for breakthrough pain.   ramipril 2.5 MG capsule Commonly known as:  ALTACE Take 2.5 mg by mouth at bedtime.            Durable Medical Equipment        Start     Ordered   06/08/16 1808  DME Bedside commode  Once    Question:  Patient needs a bedside commode to treat with the following condition  Answer:  Status post total hip replacement, left   06/08/16 1807   06/08/16 1808  DME 3 n 1  Once     06/08/16 1807   06/08/16 1808  DME Walker rolling  Once    Question:  Patient needs a walker to treat with the following condition  Answer:  Status post total hip replacement, left   06/08/16 1807      Diagnostic Studies: Dg Hip Operative Unilat W Or W/o Pelvis Left  Result Date: 06/08/2016 CLINICAL DATA:  Left hip replacement EXAM: OPERATIVE LEFT HIP WITH PELVIS COMPARISON:  None. FLUOROSCOPY TIME:  Radiation Exposure Index (as provided by the fluoroscopic device): 16.7 mGy If the device does not provide the exposure index: Fluoroscopy Time:  48 seconds Number of Acquired Images:  1 FINDINGS: Left hip replacement is noted. No acute bony or soft tissue abnormality is seen. IMPRESSION: Status post left hip replacement. Electronically Signed   By: Inez Catalina M.D.   On: 06/08/2016 16:38   Dg Hip Unilat W Or W/o Pelvis 2-3 Views Left  Result Date: 06/08/2016 CLINICAL DATA:  Postop left hip EXAM: DG HIP (WITH OR WITHOUT PELVIS) 2-3V LEFT COMPARISON:  Intraoperative films same day FINDINGS: Two views of the left hip submitted. There is left hip prosthesis with anatomic alignment. Lateral skin staples are noted. IMPRESSION: Left hip prosthesis with anatomic alignment. Electronically Signed   By: Lahoma Crocker M.D.   On: 06/08/2016 17:43    Disposition: Discharge patient to home today following morning session of PT.  Follow-up Information    Dorise Hiss CHRISTOPHER, PA-C Follow up in 14 day(s).   Specialties:  Orthopedic Surgery, Emergency Medicine Why:  Staple Removal Contact information: [place] [address] [phone]          Signed: Judson Roch PA-C 06/10/2016, 9:10 AM

## 2016-06-12 LAB — SURGICAL PATHOLOGY

## 2016-06-14 DIAGNOSIS — Z96642 Presence of left artificial hip joint: Secondary | ICD-10-CM | POA: Diagnosis not present

## 2016-06-14 DIAGNOSIS — I509 Heart failure, unspecified: Secondary | ICD-10-CM | POA: Diagnosis not present

## 2016-06-14 DIAGNOSIS — I110 Hypertensive heart disease with heart failure: Secondary | ICD-10-CM | POA: Diagnosis not present

## 2016-06-14 DIAGNOSIS — I2510 Atherosclerotic heart disease of native coronary artery without angina pectoris: Secondary | ICD-10-CM | POA: Diagnosis not present

## 2016-06-14 DIAGNOSIS — Z85820 Personal history of malignant melanoma of skin: Secondary | ICD-10-CM | POA: Diagnosis not present

## 2016-06-14 DIAGNOSIS — Z471 Aftercare following joint replacement surgery: Secondary | ICD-10-CM | POA: Diagnosis not present

## 2016-06-14 DIAGNOSIS — M1990 Unspecified osteoarthritis, unspecified site: Secondary | ICD-10-CM | POA: Diagnosis not present

## 2016-06-14 DIAGNOSIS — Z951 Presence of aortocoronary bypass graft: Secondary | ICD-10-CM | POA: Diagnosis not present

## 2016-06-14 DIAGNOSIS — Z7902 Long term (current) use of antithrombotics/antiplatelets: Secondary | ICD-10-CM | POA: Diagnosis not present

## 2016-06-14 DIAGNOSIS — I252 Old myocardial infarction: Secondary | ICD-10-CM | POA: Diagnosis not present

## 2016-06-16 DIAGNOSIS — Z471 Aftercare following joint replacement surgery: Secondary | ICD-10-CM | POA: Diagnosis not present

## 2016-06-16 DIAGNOSIS — Z96642 Presence of left artificial hip joint: Secondary | ICD-10-CM | POA: Diagnosis not present

## 2016-06-16 DIAGNOSIS — I509 Heart failure, unspecified: Secondary | ICD-10-CM | POA: Diagnosis not present

## 2016-06-16 DIAGNOSIS — I2510 Atherosclerotic heart disease of native coronary artery without angina pectoris: Secondary | ICD-10-CM | POA: Diagnosis not present

## 2016-06-16 DIAGNOSIS — M1990 Unspecified osteoarthritis, unspecified site: Secondary | ICD-10-CM | POA: Diagnosis not present

## 2016-06-16 DIAGNOSIS — I110 Hypertensive heart disease with heart failure: Secondary | ICD-10-CM | POA: Diagnosis not present

## 2016-06-18 DIAGNOSIS — I110 Hypertensive heart disease with heart failure: Secondary | ICD-10-CM | POA: Diagnosis not present

## 2016-06-18 DIAGNOSIS — Z96642 Presence of left artificial hip joint: Secondary | ICD-10-CM | POA: Diagnosis not present

## 2016-06-18 DIAGNOSIS — I509 Heart failure, unspecified: Secondary | ICD-10-CM | POA: Diagnosis not present

## 2016-06-18 DIAGNOSIS — Z471 Aftercare following joint replacement surgery: Secondary | ICD-10-CM | POA: Diagnosis not present

## 2016-06-18 DIAGNOSIS — M1990 Unspecified osteoarthritis, unspecified site: Secondary | ICD-10-CM | POA: Diagnosis not present

## 2016-06-18 DIAGNOSIS — I2510 Atherosclerotic heart disease of native coronary artery without angina pectoris: Secondary | ICD-10-CM | POA: Diagnosis not present

## 2016-06-21 DIAGNOSIS — I509 Heart failure, unspecified: Secondary | ICD-10-CM | POA: Diagnosis not present

## 2016-06-21 DIAGNOSIS — M1990 Unspecified osteoarthritis, unspecified site: Secondary | ICD-10-CM | POA: Diagnosis not present

## 2016-06-21 DIAGNOSIS — I2510 Atherosclerotic heart disease of native coronary artery without angina pectoris: Secondary | ICD-10-CM | POA: Diagnosis not present

## 2016-06-21 DIAGNOSIS — I110 Hypertensive heart disease with heart failure: Secondary | ICD-10-CM | POA: Diagnosis not present

## 2016-06-21 DIAGNOSIS — Z471 Aftercare following joint replacement surgery: Secondary | ICD-10-CM | POA: Diagnosis not present

## 2016-06-21 DIAGNOSIS — Z96642 Presence of left artificial hip joint: Secondary | ICD-10-CM | POA: Diagnosis not present

## 2016-06-22 DIAGNOSIS — M1990 Unspecified osteoarthritis, unspecified site: Secondary | ICD-10-CM | POA: Diagnosis not present

## 2016-06-22 DIAGNOSIS — I110 Hypertensive heart disease with heart failure: Secondary | ICD-10-CM | POA: Diagnosis not present

## 2016-06-22 DIAGNOSIS — Z96642 Presence of left artificial hip joint: Secondary | ICD-10-CM | POA: Diagnosis not present

## 2016-06-22 DIAGNOSIS — I2510 Atherosclerotic heart disease of native coronary artery without angina pectoris: Secondary | ICD-10-CM | POA: Diagnosis not present

## 2016-06-22 DIAGNOSIS — I509 Heart failure, unspecified: Secondary | ICD-10-CM | POA: Diagnosis not present

## 2016-06-22 DIAGNOSIS — Z471 Aftercare following joint replacement surgery: Secondary | ICD-10-CM | POA: Diagnosis not present

## 2016-07-21 DIAGNOSIS — Z96642 Presence of left artificial hip joint: Secondary | ICD-10-CM | POA: Diagnosis not present

## 2016-08-13 ENCOUNTER — Ambulatory Visit (INDEPENDENT_AMBULATORY_CARE_PROVIDER_SITE_OTHER): Payer: Medicare Other | Admitting: Family Medicine

## 2016-08-13 ENCOUNTER — Encounter: Payer: Self-pay | Admitting: Family Medicine

## 2016-08-13 VITALS — BP 110/60 | HR 69 | Temp 98.1°F | Resp 16 | Wt 136.0 lb

## 2016-08-13 DIAGNOSIS — H6123 Impacted cerumen, bilateral: Secondary | ICD-10-CM

## 2016-08-13 NOTE — Progress Notes (Addendum)
Patient: Bruce Moody Male    DOB: 10-27-1950   66 y.o.   MRN: [id number] Visit Date: 08/13/2016  Today's Provider: Lelon Huh, MD   Chief Complaint  Patient presents with  . Ear Fullness   Subjective:    Patient stated yesterday when he got out of the shower he noticed that his right ear was stopped up. Patient that there is some mild pain. No fever and no other symptoms.   Ear Fullness   There is pain in the right ear. This is a new problem. The current episode started yesterday. There has been no fever. The pain is mild. Pertinent negatives include no abdominal pain, diarrhea, ear discharge, headaches, hearing loss, neck pain, rash, rhinorrhea, sore throat or vomiting. He has tried nothing for the symptoms.      No Known Allergies   Current Outpatient Prescriptions:  .  acetaminophen (TYLENOL) 500 MG tablet, Take 1,000 mg by mouth every 6 (six) hours as needed (for pain.)., Disp: , Rfl:  .  aspirin EC 81 MG tablet, Take 81 mg by mouth daily., Disp: , Rfl:  .  atorvastatin (LIPITOR) 40 MG tablet, Take 40 mg by mouth at bedtime. , Disp: , Rfl:  .  carvedilol (COREG) 3.125 MG tablet, Take 3.125 mg by mouth 2 (two) times daily with a meal. , Disp: , Rfl:  .  clopidogrel (PLAVIX) 75 MG tablet, Take 75 mg by mouth daily. , Disp: , Rfl:  .  ibuprofen (ADVIL,MOTRIN) 200 MG tablet, Take 200 mg by mouth every 8 (eight) hours as needed (for pain.)., Disp: , Rfl:  .  ramipril (ALTACE) 2.5 MG capsule, Take 2.5 mg by mouth at bedtime. , Disp: , Rfl:   Review of Systems  HENT: Negative for ear discharge, hearing loss, rhinorrhea and sore throat.   Gastrointestinal: Negative for abdominal pain, diarrhea and vomiting.  Musculoskeletal: Negative for neck pain.  Skin: Negative for rash.  Neurological: Negative for headaches.    Social History  Substance Use Topics  . Smoking status: Former Smoker    Types: Cigarettes    Quit date: 07/19/1996  . Smokeless tobacco: Never Used    . Alcohol use 3.0 oz/week    5 Glasses of wine per week   Objective:   BP 110/60 (BP Location: Right Arm, Patient Position: Sitting, Cuff Size: Normal)   Pulse 69   Temp 98.1 F (36.7 C) (Oral)   Resp 16   Wt 136 lb (61.7 kg)   SpO2 98%   BMI 24.09 kg/m   Physical Exam  General Appearance:    Alert, cooperative, no distress  HENT:   Both ear canals obstructed with cerumen  Eyes:    PERRL, conjunctiva/corneas clear, EOM's intact       Lungs:     Clear to auscultation bilaterally, respirations unlabored  Heart:    Regular rate and rhythm  Neurologic:   Awake, alert, oriented x 3. No apparent focal neurological           defect.           Assessment & Plan:     1. Bilateral impacted cerumen Both ears were instilled with Debrox drops. Left ear spontaneously cleared. Right ear canal was irrigated by CMA an several small bits of cerumen washed away. Patient reports improvement in hearing after procedure, but there was still excessive cerumen visualized in canal. He was instructed to use OTC Debrox in right ear canal the next  three nights and to stop by Office first thing Monday morning to see if cerumen has cleared. Consider ENT referral if not.     Addendum 08/16/2016  Patient returns today for recheck RIGHT ear canal. He has been using OTC Debrox over the weekend and reports he did get a large chunk of wax out, but ear canal still feels full. CMA Irrigated ear canal again this morning and rinsed out another medium sized ball of cerumen, but canal remains obstructed. Irrigation was discontinued as canal is starting to be sore. Patient would like to go ahead and be referred to ENT for cerumen extraction.       Lelon Huh, MD  [facility]

## 2016-08-16 NOTE — Addendum Note (Signed)
Addended by: Birdie Sons on: 08/16/2016 08:25 AM   Modules accepted: Orders

## 2016-08-19 DIAGNOSIS — H9011 Conductive hearing loss, unilateral, right ear, with unrestricted hearing on the contralateral side: Secondary | ICD-10-CM | POA: Insufficient documentation

## 2016-08-19 DIAGNOSIS — H6121 Impacted cerumen, right ear: Secondary | ICD-10-CM | POA: Insufficient documentation

## 2016-09-20 DIAGNOSIS — I25810 Atherosclerosis of coronary artery bypass graft(s) without angina pectoris: Secondary | ICD-10-CM | POA: Diagnosis not present

## 2016-09-20 DIAGNOSIS — I10 Essential (primary) hypertension: Secondary | ICD-10-CM | POA: Diagnosis not present

## 2016-09-20 DIAGNOSIS — I255 Ischemic cardiomyopathy: Secondary | ICD-10-CM | POA: Diagnosis not present

## 2016-12-02 DIAGNOSIS — X32XXXA Exposure to sunlight, initial encounter: Secondary | ICD-10-CM | POA: Diagnosis not present

## 2016-12-02 DIAGNOSIS — D485 Neoplasm of uncertain behavior of skin: Secondary | ICD-10-CM | POA: Diagnosis not present

## 2016-12-02 DIAGNOSIS — L570 Actinic keratosis: Secondary | ICD-10-CM | POA: Diagnosis not present

## 2016-12-02 DIAGNOSIS — D225 Melanocytic nevi of trunk: Secondary | ICD-10-CM | POA: Diagnosis not present

## 2016-12-02 DIAGNOSIS — D044 Carcinoma in situ of skin of scalp and neck: Secondary | ICD-10-CM | POA: Diagnosis not present

## 2016-12-02 DIAGNOSIS — Z85820 Personal history of malignant melanoma of skin: Secondary | ICD-10-CM | POA: Diagnosis not present

## 2016-12-02 DIAGNOSIS — D0439 Carcinoma in situ of skin of other parts of face: Secondary | ICD-10-CM | POA: Diagnosis not present

## 2016-12-02 DIAGNOSIS — Z85828 Personal history of other malignant neoplasm of skin: Secondary | ICD-10-CM | POA: Diagnosis not present

## 2016-12-29 DIAGNOSIS — L578 Other skin changes due to chronic exposure to nonionizing radiation: Secondary | ICD-10-CM | POA: Diagnosis not present

## 2016-12-29 DIAGNOSIS — L570 Actinic keratosis: Secondary | ICD-10-CM | POA: Diagnosis not present

## 2016-12-29 DIAGNOSIS — Z85828 Personal history of other malignant neoplasm of skin: Secondary | ICD-10-CM | POA: Diagnosis not present

## 2016-12-29 DIAGNOSIS — D099 Carcinoma in situ, unspecified: Secondary | ICD-10-CM | POA: Diagnosis not present

## 2017-03-09 DIAGNOSIS — Z85828 Personal history of other malignant neoplasm of skin: Secondary | ICD-10-CM | POA: Diagnosis not present

## 2017-03-28 DIAGNOSIS — E782 Mixed hyperlipidemia: Secondary | ICD-10-CM | POA: Diagnosis not present

## 2017-03-28 DIAGNOSIS — I255 Ischemic cardiomyopathy: Secondary | ICD-10-CM | POA: Diagnosis not present

## 2017-03-28 DIAGNOSIS — I10 Essential (primary) hypertension: Secondary | ICD-10-CM | POA: Diagnosis not present

## 2017-03-28 DIAGNOSIS — I25810 Atherosclerosis of coronary artery bypass graft(s) without angina pectoris: Secondary | ICD-10-CM | POA: Diagnosis not present

## 2017-05-04 ENCOUNTER — Ambulatory Visit (INDEPENDENT_AMBULATORY_CARE_PROVIDER_SITE_OTHER): Payer: Medicare Other

## 2017-05-04 DIAGNOSIS — Z23 Encounter for immunization: Secondary | ICD-10-CM

## 2017-06-02 DIAGNOSIS — D225 Melanocytic nevi of trunk: Secondary | ICD-10-CM | POA: Diagnosis not present

## 2017-06-02 DIAGNOSIS — Z85820 Personal history of malignant melanoma of skin: Secondary | ICD-10-CM | POA: Diagnosis not present

## 2017-06-02 DIAGNOSIS — Z85828 Personal history of other malignant neoplasm of skin: Secondary | ICD-10-CM | POA: Diagnosis not present

## 2017-06-02 DIAGNOSIS — L218 Other seborrheic dermatitis: Secondary | ICD-10-CM | POA: Diagnosis not present

## 2017-09-05 ENCOUNTER — Telehealth: Payer: Self-pay | Admitting: Family Medicine

## 2017-09-27 DIAGNOSIS — I10 Essential (primary) hypertension: Secondary | ICD-10-CM | POA: Diagnosis not present

## 2017-09-27 DIAGNOSIS — I255 Ischemic cardiomyopathy: Secondary | ICD-10-CM | POA: Diagnosis not present

## 2017-09-27 DIAGNOSIS — E782 Mixed hyperlipidemia: Secondary | ICD-10-CM | POA: Diagnosis not present

## 2017-09-27 DIAGNOSIS — I25810 Atherosclerosis of coronary artery bypass graft(s) without angina pectoris: Secondary | ICD-10-CM | POA: Diagnosis not present

## 2017-10-04 ENCOUNTER — Encounter: Payer: Self-pay | Admitting: Family Medicine

## 2017-10-04 ENCOUNTER — Ambulatory Visit (INDEPENDENT_AMBULATORY_CARE_PROVIDER_SITE_OTHER): Payer: Medicare Other

## 2017-10-04 ENCOUNTER — Ambulatory Visit (INDEPENDENT_AMBULATORY_CARE_PROVIDER_SITE_OTHER): Payer: Medicare Other | Admitting: Family Medicine

## 2017-10-04 VITALS — BP 98/60 | HR 68 | Temp 98.5°F | Ht 62.0 in

## 2017-10-04 DIAGNOSIS — Z125 Encounter for screening for malignant neoplasm of prostate: Secondary | ICD-10-CM

## 2017-10-04 DIAGNOSIS — I25810 Atherosclerosis of coronary artery bypass graft(s) without angina pectoris: Secondary | ICD-10-CM | POA: Diagnosis not present

## 2017-10-04 DIAGNOSIS — Z23 Encounter for immunization: Secondary | ICD-10-CM | POA: Diagnosis not present

## 2017-10-04 DIAGNOSIS — I10 Essential (primary) hypertension: Secondary | ICD-10-CM

## 2017-10-04 DIAGNOSIS — Z0000 Encounter for general adult medical examination without abnormal findings: Secondary | ICD-10-CM | POA: Diagnosis not present

## 2017-10-04 NOTE — Progress Notes (Signed)
       Patient: Bruce Moody Male    DOB: Aug 24, 1950   67 y.o.   MRN: [id number] Visit Date: 10/04/2017  Today's Provider: Lelon Huh, MD   Chief Complaint  Patient presents with  . Annual Exam  . Coronary Artery Disease   Subjective:   Patient was seen by [facility] for AWV today at 1:00 pm.  HPI  CAD, Hypertension, hyperlipidemia follow up    States he feels well and taking medications consistently. Is not having any chest pains or shortness of breath. Had follow up with cardiology PA on 3/12/ 2019 with no changes made. Is having no side effects from medication.   No Known Allergies   Current Outpatient Medications:  .  acetaminophen (TYLENOL) 500 MG tablet, Take 1,000 mg by mouth every 6 (six) hours as needed (for pain.)., Disp: , Rfl:  .  aspirin EC 81 MG tablet, Take 81 mg by mouth daily., Disp: , Rfl:  .  atorvastatin (LIPITOR) 40 MG tablet, Take 40 mg by mouth at bedtime. , Disp: , Rfl:  .  carvedilol (COREG) 3.125 MG tablet, Take 3.125 mg by mouth 2 (two) times daily with a meal. , Disp: , Rfl:  .  clopidogrel (PLAVIX) 75 MG tablet, Take 75 mg by mouth daily. , Disp: , Rfl:  .  ibuprofen (ADVIL,MOTRIN) 200 MG tablet, Take 200 mg by mouth every 8 (eight) hours as needed (for pain.)., Disp: , Rfl:  .  ramipril (ALTACE) 2.5 MG capsule, Take 2.5 mg by mouth at bedtime. , Disp: , Rfl:   Review of Systems  Musculoskeletal: Positive for arthralgias.  Hematological: Bruises/bleeds easily.  All other systems reviewed and are negative.   Social History   Tobacco Use  . Smoking status: Former Smoker    Types: Cigarettes    Last attempt to quit: 07/19/1996    Years since quitting: 21.2  . Smokeless tobacco: Never Used  Substance Use Topics  . Alcohol use: Yes    Alcohol/week: 3.0 - 3.6 oz    Types: 5 - 6 Glasses of wine per week   Objective:    BP  98/60 (BP Location: Left Arm)   Pulse  68   Temp  98.5 F (36.9 C) (Oral)   Ht  5\' 2"  (1.575 m)   BMI  24.87  kg/m       Physical Exam   General Appearance:    Alert, cooperative, no distress  Eyes:    PERRL, conjunctiva/corneas clear, EOM's intact       Lungs:     Clear to auscultation bilaterally, respirations unlabored  Heart:    Regular rate and rhythm  Neurologic:   Awake, alert, oriented x 3. No apparent focal neurological           defect.          Assessment & Plan:     1. Benign essential HTN Well controlled.  Continue current medications.   - Comprehensive metabolic panel  2. 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 Compliant with medication.  Continue aggressive risk factor modification.  Continue regular follow up with cardiology.  - Lipid panel  3. Prostate cancer screening  - PSA       Lelon Huh, MD  [facility]

## 2017-10-04 NOTE — Progress Notes (Signed)
Subjective:   Bruce Moody is a 67 y.o. male who presents for an Initial Medicare Annual Wellness Visit.  Review of Systems  N/A  Cardiac Risk Factors include: advanced age (>59men, >22 women);dyslipidemia;hypertension;male gender    Objective:    Today's Vitals   10/04/17 1258  BP: 98/60  Pulse: 68  Temp: 98.5 F (36.9 C)  TempSrc: Oral  Height: 5\' 2"  (1.575 m)  PainSc: 0-No pain   Body mass index is 24.87 kg/m.  Advanced Directives 10/04/2017 06/08/2016 05/26/2016  Does Patient Have a Medical Advance Directive? Yes Yes Yes  Type of Paramedic of [place];Living will [place];Living will [place];Living will  Does patient want to make changes to medical advance directive? - No - Patient declined -  Copy of [place] in Chart? No - copy requested - -    Current Medications (verified) Outpatient Encounter Medications as of 10/04/2017  Medication Sig  . acetaminophen (TYLENOL) 500 MG tablet Take 1,000 mg by mouth every 6 (six) hours as needed (for pain.).  Marland Kitchen aspirin EC 81 MG tablet Take 81 mg by mouth daily.  Marland Kitchen atorvastatin (LIPITOR) 40 MG tablet Take 40 mg by mouth at bedtime.   . carvedilol (COREG) 3.125 MG tablet Take 3.125 mg by mouth 2 (two) times daily with a meal.   . clopidogrel (PLAVIX) 75 MG tablet Take 75 mg by mouth daily.   Marland Kitchen ibuprofen (ADVIL,MOTRIN) 200 MG tablet Take 200 mg by mouth every 8 (eight) hours as needed (for pain.).  Marland Kitchen ramipril (ALTACE) 2.5 MG capsule Take 2.5 mg by mouth at bedtime.    No facility-administered encounter medications on file as of 10/04/2017.     Allergies (verified) Patient has no known allergies.   History: Past Medical History:  Diagnosis Date  . Arthritis   . Cancer [facility]) 2011   Melanoma  . Coronary artery disease   . Elevated lipids   . Heart disease   . Hypertension   . Myocardial infarction [facility])    Past Surgical History:    Procedure Laterality Date  . BASAL CELL CARCINOMA EXCISION     right eyelid  . CARDIAC CATHETERIZATION    . COLONOSCOPY  2004  . CORONARY ANGIOPLASTY    . CORONARY ARTERY BYPASS GRAFT  1998  . CORONARY ARTERY BYPASS GRAFT     3 vessels  . HERNIA REPAIR Left 2008   inguinal  . HERNIA REPAIR Right 07-05-13   inguinal   . ROTATOR CUFF REPAIR  2005  . TONSILLECTOMY    . TOTAL HIP ARTHROPLASTY Left 06/08/2016   Procedure: TOTAL HIP ARTHROPLASTY ANTERIOR APPROACH;  Surgeon: Hessie Knows, MD;  Location: [facility] ORS;  Service: Orthopedics;  Laterality: Left;   Family History  Problem Relation Age of Onset  . Lymphoma Father   . Heart disease Mother   . Dementia Mother   . Arthritis Sister   . Heart disease Sister   . COPD Sister    Social History   Socioeconomic History  . Marital status: Married    Spouse name: None  . Number of children: 1  . Years of education: None  . Highest education level: Bachelor's degree (e.g., BA, AB, BS)  Social Needs  . Financial resource strain: Not hard at all  . Food insecurity - worry: Never true  . Food insecurity - inability: Never true  . Transportation needs - medical: No  . Transportation needs - non-medical:  No  Occupational History  . None  Tobacco Use  . Smoking status: Former Smoker    Types: Cigarettes    Last attempt to quit: 07/19/1996    Years since quitting: 21.2  . Smokeless tobacco: Never Used  Substance and Sexual Activity  . Alcohol use: Yes    Alcohol/week: 3.0 - 3.6 oz    Types: 5 - 6 Glasses of wine per week  . Drug use: No  . Sexual activity: None  Other Topics Concern  . None  Social History Narrative  . None   Tobacco Counseling Counseling given: Not Answered   Clinical Intake:  Pre-visit preparation completed: Yes  Pain : No/denies pain Pain Score: 0-No pain     Nutritional Status: BMI of 19-24  Normal Nutritional Risks: None Diabetes: No  How often do you need to have someone help you when  you read instructions, pamphlets, or other written materials from your doctor or pharmacy?: 1 - Never  Interpreter Needed?: No  Information entered by :: [facility], LPN  Activities of Daily Living In your present state of health, do you have any difficulty performing the following activities: 10/04/2017  Hearing? N  Vision? N  Difficulty concentrating or making decisions? N  Walking or climbing stairs? N  Dressing or bathing? N  Doing errands, shopping? N  Preparing Food and eating ? N  Using the Toilet? N  In the past six months, have you accidently leaked urine? N  Do you have problems with loss of bowel control? N  Managing your Medications? N  Managing your Finances? N  Housekeeping or managing your Housekeeping? N  Some recent data might be hidden     Immunizations and Health Maintenance Immunization History  Administered Date(s) Administered  . Influenza Split 05/12/2011, 05/18/2012  . Influenza, High Dose Seasonal PF 06/05/2015, 05/20/2016, 05/04/2017  . Influenza,inj,Quad PF,6+ Mos 05/16/2013, 05/16/2014  . Pneumococcal Conjugate-13 10/04/2017  . Tdap 02/20/2014   There are no preventive care reminders to display for this patient.  Patient Care Team: Birdie Sons, MD as PCP - General (Family Medicine) Corey Skains, MD as Consulting Physician (Cardiology) Oneta Rack, MD as Consulting Physician (Dermatology) Hessie Knows, MD as Consulting Physician (Orthopedic Surgery)  Indicate any recent Medical Services you may have received from other than [facility] providers in the past year (date may be approximate).    Assessment:   This is a routine wellness examination for Bruce Moody.  Hearing/Vision screen Vision Screening Comments: Pt does not have regular vision checks. Last eye exam was 3-4 years ago. No current complaints.   Dietary issues and exercise activities discussed: Current Exercise Habits: The patient does not participate in regular exercise at  present(does walk occasionally weather permitting), Exercise limited by: None identified  Goals    . DIET - INCREASE WATER INTAKE     Recommend increasing water intake to 3 glasses or more a day.       Depression Screen PHQ 2/9 Scores 10/04/2017  PHQ - 2 Score 0    Fall Risk Fall Risk  10/04/2017  Falls in the past year? No    Is the patient's home free of loose throw rugs in walkways, pet beds, electrical cords, etc?   yes      Grab bars in the bathroom? no      Handrails on the stairs?   yes      Adequate lighting?   yes  Timed Get Up and Go performed: N/A  Cognitive  Function: Pt declined screening today.         Screening Tests Health Maintenance  Topic Date Due  . PNA vac Low Risk Adult (2 of 2 - PPSV23) 10/05/2018  . TETANUS/TDAP  02/21/2024  . COLONOSCOPY  05/28/2024  . INFLUENZA VACCINE  Completed  . Hepatitis C Screening  Completed    Qualifies for Shingles Vaccine? Due for Shingles vaccine. Declined my offer to administer today. Education has been provided regarding the importance of this vaccine. Pt has been advised to call her insurance company to determine her out of pocket expense. Advised she may also receive this vaccine at her local pharmacy or Health Dept. Verbalized acceptance and understanding.  Cancer Screenings: Lung: Low Dose CT Chest recommended if Age 44-80 years, 30 pack-year currently smoking OR have quit w/in 15years. Patient does not qualify. Colorectal: Up to date  Additional Screenings:  Hepatitis C Screening: Up to date     Plan:  I have personally reviewed and addressed the Medicare Annual Wellness questionnaire and have noted the following in the patient's chart:  A. Medical and social history B. Use of alcohol, tobacco or illicit drugs  C. Current medications and supplements D. Functional ability and status E.  Nutritional status F.  Physical activity G. Advance directives H. List of other physicians I.  Hospitalizations,  surgeries, and ER visits in previous 12 months J.  [place] such as hearing and vision if needed, cognitive and depression L. Referrals and appointments - none  In addition, I have reviewed and discussed with patient certain preventive protocols, quality metrics, and best practice recommendations. A written personalized care plan for preventive services as well as general preventive health recommendations were provided to patient.  See attached scanned questionnaire for additional information.   Signed,  Fabio Neighbors, LPN Nurse Health Advisor   Nurse Recommendations: None.

## 2017-10-04 NOTE — Patient Instructions (Addendum)
Bruce Moody , Thank you for taking time to come for your Medicare Wellness Visit. I appreciate your ongoing commitment to your health goals. Please review the following plan we discussed and let me know if I can assist you in the future.   Screening recommendations/referrals: Colonoscopy: Up to date Recommended yearly ophthalmology/optometry visit for glaucoma screening and checkup Recommended yearly dental visit for hygiene and checkup  Vaccinations: Influenza vaccine: Up to date Pneumococcal vaccine: Up to date Tdap vaccine: Up to date Shingles vaccine: Pt declines today.     Advanced directives: Please bring a copy of your POA (Power of Attorney) and/or Living Will to your next appointment.   Conditions/risks identified: Recommend increasing water intake to 3 glasses or more a day.   Next appointment: 2:00 PM today with Dr Caryn Section.   Preventive Care 1 Years and Older, Male Preventive care refers to lifestyle choices and visits with your health care provider that can promote health and wellness. What does preventive care include?  A yearly physical exam. This is also called an annual well check.  Dental exams once or twice a year.  Routine eye exams. Ask your health care provider how often you should have your eyes checked.  Personal lifestyle choices, including:  Daily care of your teeth and gums.  Regular physical activity.  Eating a healthy diet.  Avoiding tobacco and drug use.  Limiting alcohol use.  Practicing safe sex.  Taking low doses of aspirin every day.  Taking vitamin and mineral supplements as recommended by your health care provider. What happens during an annual well check? The services and screenings done by your health care provider during your annual well check will depend on your age, overall health, lifestyle risk factors, and family history of disease. Counseling  Your health care provider may ask you questions about your:  Alcohol  use.  Tobacco use.  Drug use.  Emotional well-being.  Home and relationship well-being.  Sexual activity.  Eating habits.  History of falls.  Memory and ability to understand (cognition).  Work and work Statistician. Screening  You may have the following tests or measurements:  Height, weight, and BMI.  Blood pressure.  Lipid and cholesterol levels. These may be checked every 5 years, or more frequently if you are over 71 years old.  Skin check.  Lung cancer screening. You may have this screening every year starting at age 51 if you have a 30-pack-year history of smoking and currently smoke or have quit within the past 15 years.  Fecal occult blood test (FOBT) of the stool. You may have this test every year starting at age 25.  Flexible sigmoidoscopy or colonoscopy. You may have a sigmoidoscopy every 5 years or a colonoscopy every 10 years starting at age 28.  Prostate cancer screening. Recommendations will vary depending on your family history and other risks.  Hepatitis C blood test.  Hepatitis B blood test.  Sexually transmitted disease (STD) testing.  Diabetes screening. This is done by checking your blood sugar (glucose) after you have not eaten for a while (fasting). You may have this done every 1-3 years.  Abdominal aortic aneurysm (AAA) screening. You may need this if you are a current or former smoker.  Osteoporosis. You may be screened starting at age 23 if you are at high risk. Talk with your health care provider about your test results, treatment options, and if necessary, the need for more tests. Vaccines  Your health care provider may recommend certain vaccines, such  as:  Influenza vaccine. This is recommended every year.  Tetanus, diphtheria, and acellular pertussis (Tdap, Td) vaccine. You may need a Td booster every 10 years.  Zoster vaccine. You may need this after age 11.  Pneumococcal 13-valent conjugate (PCV13) vaccine. One dose is  recommended after age 36.  Pneumococcal polysaccharide (PPSV23) vaccine. One dose is recommended after age 64. Talk to your health care provider about which screenings and vaccines you need and how often you need them. This information is not intended to replace advice given to you by your health care provider. Make sure you discuss any questions you have with your health care provider. Document Released: 08/01/2015 Document Revised: 03/24/2016 Document Reviewed: 05/06/2015 Elsevier Interactive Patient Education  2017 [place] Prevention in the Home Falls can cause injuries. They can happen to people of all ages. There are many things you can do to make your home safe and to help prevent falls. What can I do on the outside of my home?  Regularly fix the edges of walkways and driveways and fix any cracks.  Remove anything that might make you trip as you walk through a door, such as a raised step or threshold.  Trim any bushes or trees on the path to your home.  Use bright outdoor lighting.  Clear any walking paths of anything that might make someone trip, such as rocks or tools.  Regularly check to see if handrails are loose or broken. Make sure that both sides of any steps have handrails.  Any raised decks and porches should have guardrails on the edges.  Have any leaves, snow, or ice cleared regularly.  Use sand or salt on walking paths during winter.  Clean up any spills in your garage right away. This includes oil or grease spills. What can I do in the bathroom?  Use night lights.  Install grab bars by the toilet and in the tub and shower. Do not use towel bars as grab bars.  Use non-skid mats or decals in the tub or shower.  If you need to sit down in the shower, use a plastic, non-slip stool.  Keep the floor dry. Clean up any water that spills on the floor as soon as it happens.  Remove soap buildup in the tub or shower regularly.  Attach bath mats  securely with double-sided non-slip rug tape.  Do not have throw rugs and other things on the floor that can make you trip. What can I do in the bedroom?  Use night lights.  Make sure that you have a light by your bed that is easy to reach.  Do not use any sheets or blankets that are too big for your bed. They should not hang down onto the floor.  Have a firm chair that has side arms. You can use this for support while you get dressed.  Do not have throw rugs and other things on the floor that can make you trip. What can I do in the kitchen?  Clean up any spills right away.  Avoid walking on wet floors.  Keep items that you use a lot in easy-to-reach places.  If you need to reach something above you, use a strong step stool that has a grab bar.  Keep electrical cords out of the way.  Do not use floor polish or wax that makes floors slippery. If you must use wax, use non-skid floor wax.  Do not have throw rugs and other things on the  floor that can make you trip. What can I do with my stairs?  Do not leave any items on the stairs.  Make sure that there are handrails on both sides of the stairs and use them. Fix handrails that are broken or loose. Make sure that handrails are as long as the stairways.  Check any carpeting to make sure that it is firmly attached to the stairs. Fix any carpet that is loose or worn.  Avoid having throw rugs at the top or bottom of the stairs. If you do have throw rugs, attach them to the floor with carpet tape.  Make sure that you have a light switch at the top of the stairs and the bottom of the stairs. If you do not have them, ask someone to add them for you. What else can I do to help prevent falls?  Wear shoes that:  Do not have high heels.  Have rubber bottoms.  Are comfortable and fit you well.  Are closed at the toe. Do not wear sandals.  If you use a stepladder:  Make sure that it is fully opened. Do not climb a closed  stepladder.  Make sure that both sides of the stepladder are locked into place.  Ask someone to hold it for you, if possible.  Clearly mark and make sure that you can see:  Any grab bars or handrails.  First and last steps.  Where the edge of each step is.  Use tools that help you move around (mobility aids) if they are needed. These include:  Canes.  Walkers.  Scooters.  Crutches.  Turn on the lights when you go into a dark area. Replace any light bulbs as soon as they burn out.  Set up your furniture so you have a clear path. Avoid moving your furniture around.  If any of your floors are uneven, fix them.  If there are any pets around you, be aware of where they are.  Review your medicines with your doctor. Some medicines can make you feel dizzy. This can increase your chance of falling. Ask your doctor what other things that you can do to help prevent falls. This information is not intended to replace advice given to you by your health care provider. Make sure you discuss any questions you have with your health care provider. Document Released: 05/01/2009 Document Revised: 12/11/2015 Document Reviewed: 08/09/2014 Elsevier Interactive Patient Education  2017 [employer].

## 2017-10-04 NOTE — Progress Notes (Deleted)
Patient: Bruce Moody, Male    DOB: May 05, 1951, 67 y.o.   MRN: [id number] Visit Date: 10/04/2017  Today's Provider: Lelon Huh, MD   No chief complaint on file.  Subjective:   Patient saw McKenzie at 1:00 pm today for AWV.   Complete Physical Bruce Moody is a 67 y.o. male. He feels {DESC; WELL/FAIRLY WELL/POORLY:18703}. He reports exercising ***. He reports he is sleeping {DESC; WELL/FAIRLY WELL/POORLY:18703}.  -----------------------------------------------------------  CAD From 02/20/2014-labs checked, no changes.   Review of Systems  Social History   Socioeconomic History  . Marital status: Married    Spouse name: Not on file  . Number of children: Not on file  . Years of education: Not on file  . Highest education level: Not on file  Social Needs  . Financial resource strain: Not on file  . Food insecurity - worry: Not on file  . Food insecurity - inability: Not on file  . Transportation needs - medical: Not on file  . Transportation needs - non-medical: Not on file  Occupational History  . Not on file  Tobacco Use  . Smoking status: Former Smoker    Types: Cigarettes    Last attempt to quit: 07/19/1996    Years since quitting: 21.2  . Smokeless tobacco: Never Used  Substance and Sexual Activity  . Alcohol use: Yes    Alcohol/week: 3.0 oz    Types: 5 Glasses of wine per week  . Drug use: No  . Sexual activity: Not on file  Other Topics Concern  . Not on file  Social History Narrative  . Not on file    Past Medical History:  Diagnosis Date  . Arthritis   . Cancer [facility]) 2011   Melanoma  . Coronary artery disease   . Elevated lipids   . Heart disease   . Hypertension   . Myocardial infarction      Patient Active Problem List   Diagnosis Date Noted  . Status post total hip replacement, left 06/08/2016  . CA in situ 12/29/2015  . Congestive heart failure ([place]) 12/29/2015  . Diverticulitis 12/29/2015  . ED (erectile dysfunction) of  organic origin 12/29/2015  . Calculus of kidney 12/29/2015  . Benign essential HTN 04/02/2014  . 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 Presence of coronary angioplasty implant and graft 11/20/2013  . Right inguinal hernia 06/28/2013    Past Surgical History:  Procedure Laterality Date  . BASAL CELL CARCINOMA EXCISION     right eyelid  . CARDIAC CATHETERIZATION    . COLONOSCOPY  2004  . CORONARY ANGIOPLASTY    . CORONARY ARTERY BYPASS GRAFT  1998  . CORONARY ARTERY BYPASS GRAFT     3 vessels  . HERNIA REPAIR Left 2008   inguinal  . HERNIA REPAIR Right 07-05-13   inguinal   . ROTATOR CUFF REPAIR  2005  . TONSILLECTOMY    . TOTAL HIP ARTHROPLASTY Left 06/08/2016   Procedure: TOTAL HIP ARTHROPLASTY ANTERIOR APPROACH;  Surgeon: Hessie Knows, MD;  Location: [facility] ORS;  Service: Orthopedics;  Laterality: Left;    His family history includes Arthritis in his sister; Heart disease in his mother; Lymphoma in his father.      Current Outpatient Medications:  .  acetaminophen (TYLENOL) 500 MG tablet, Take 1,000 mg by mouth every 6 (six) hours as needed (for pain.)., Disp: ,  Rfl:  .  aspirin EC 81 MG tablet, Take 81 mg by mouth daily., Disp: , Rfl:  .  atorvastatin (LIPITOR) 40 MG tablet, Take 40 mg by mouth at bedtime. , Disp: , Rfl:  .  carvedilol (COREG) 3.125 MG tablet, Take 3.125 mg by mouth 2 (two) times daily with a meal. , Disp: , Rfl:  .  clopidogrel (PLAVIX) 75 MG tablet, Take 75 mg by mouth daily. , Disp: , Rfl:  .  ibuprofen (ADVIL,MOTRIN) 200 MG tablet, Take 200 mg by mouth every 8 (eight) hours as needed (for pain.)., Disp: , Rfl:  .  ramipril (ALTACE) 2.5 MG capsule, Take 2.5 mg by mouth at bedtime. , Disp: , Rfl:   Patient Care Team: Birdie Sons, MD as PCP - General (Family Medicine) Birdie Sons, MD as Referring Physician (Family  Medicine) Bary Castilla Forest Gleason, MD (General Surgery)     Objective:   Vitals: There were no vitals taken for this visit.  Physical Exam  Activities of Daily Living No flowsheet data found.  Fall Risk Assessment No flowsheet data found.   Depression Screen No flowsheet data found.  Cognitive Testing - 6-CIT  Correct? Score   What year is it? {yes no:22349} {0-4:31231} 0 or 4  What month is it? {yes no:22349} {0-3:21082} 0 or 3  Memorize:    Bruce Moody,  40,  [address],  [place],      What time is it? (within 1 hour) {yes no:22349} {0-3:21082} 0 or 3  Count backwards from 20 {yes no:22349} {0-4:31231} 0, 2, or 4  Name the months of the year {yes no:22349} {0-4:31231} 0, 2, or 4  Repeat name & address above {yes no:22349} {0-10:5044} 0, 2, 4, 6, 8, or 10       TOTAL SCORE  ***/28   Interpretation:  {normal/abnormal:11317::"Normal"}  Normal (0-7) Abnormal (8-28)       Assessment & Plan:    Annual Physical Reviewed patient's Family Medical History Reviewed and updated list of patient's medical providers Assessment of cognitive impairment was done Assessed patient's functional ability Established a written schedule for health screening [place] Completed and Reviewed  Exercise Activities and Dietary recommendations Goals    None      Immunization History  Administered Date(s) Administered  . Influenza Split 05/12/2011, 05/18/2012  . Influenza, High Dose Seasonal PF 06/05/2015, 05/20/2016, 05/04/2017  . Influenza,inj,Quad PF,6+ Mos 05/16/2013, 05/16/2014  . Tdap 02/20/2014    Health Maintenance  Topic Date Due  . PNA vac Low Risk Adult (1 of 2 - PCV13) 03/09/2014  . TETANUS/TDAP  02/21/2024  . COLONOSCOPY  05/28/2024  . INFLUENZA VACCINE  Completed  . Hepatitis C Screening  Completed     Discussed health benefits of physical activity, and encouraged him to engage in regular exercise appropriate for his age and condition.     ------------------------------------------------------------------------------------------------------------    Lelon Huh, MD  [place]

## 2017-10-04 NOTE — Patient Instructions (Addendum)
 The CDC recommends two doses of Shingrix (the shingles vaccine) separated by 2 to 6 months for adults age 69 years and older. I recommend checking with your insurance plan regarding coverage for this vaccine.    Preventive Care 65 Years and Older, Male Preventive care refers to lifestyle choices and visits with your health care provider that can promote health and wellness. What does preventive care include?  A yearly physical exam. This is also called an annual well check.  Dental exams once or twice a year.  Routine eye exams. Ask your health care provider how often you should have your eyes checked.  Personal lifestyle choices, including: ? Daily care of your teeth and gums. ? Regular physical activity. ? Eating a healthy diet. ? Avoiding tobacco and drug use. ? Limiting alcohol use. ? Practicing safe sex. ? Taking low doses of aspirin every day. ? Taking vitamin and mineral supplements as recommended by your health care provider. What happens during an annual well check? The services and screenings done by your health care provider during your annual well check will depend on your age, overall health, lifestyle risk factors, and family history of disease. Counseling Your health care provider may ask you questions about your:  Alcohol use.  Tobacco use.  Drug use.  Emotional well-being.  Home and relationship well-being.  Sexual activity.  Eating habits.  History of falls.  Memory and ability to understand (cognition).  Work and work environment.  Screening You may have the following tests or measurements:  Height, weight, and BMI.  Blood pressure.  Lipid and cholesterol levels. These may be checked every 5 years, or more frequently if you are over 69 years old.  Skin check.  Lung cancer screening. You may have this screening every year starting at age 55 if you have a 30-pack-year history of smoking and currently smoke or have quit within the past 15  years.  Fecal occult blood test (FOBT) of the stool. You may have this test every year starting at age 69.  Flexible sigmoidoscopy or colonoscopy. You may have a sigmoidoscopy every 5 years or a colonoscopy every 10 years starting at age 69.  Prostate cancer screening. Recommendations will vary depending on your family history and other risks.  Hepatitis C blood test.  Hepatitis B blood test.  Sexually transmitted disease (STD) testing.  Diabetes screening. This is done by checking your blood sugar (glucose) after you have not eaten for a while (fasting). You may have this done every 1-3 years.  Abdominal aortic aneurysm (AAA) screening. You may need this if you are a current or former smoker.  Osteoporosis. You may be screened starting at age 70 if you are at high risk.  Talk with your health care provider about your test results, treatment options, and if necessary, the need for more tests. Vaccines Your health care provider may recommend certain vaccines, such as:  Influenza vaccine. This is recommended every year.  Tetanus, diphtheria, and acellular pertussis (Tdap, Td) vaccine. You may need a Td booster every 10 years.  Varicella vaccine. You may need this if you have not been vaccinated.  Zoster vaccine. You may need this after age 60.  Measles, mumps, and rubella (MMR) vaccine. You may need at least one dose of MMR if you were born in 1957 or later. You may also need a second dose.  Pneumococcal 13-valent conjugate (PCV13) vaccine. One dose is recommended after age 65.  Pneumococcal polysaccharide (PPSV23) vaccine. One dose is   is recommended after age 27.  Meningococcal vaccine. You may need this if you have certain conditions.  Hepatitis A vaccine. You may need this if you have certain conditions or if you travel or work in places where you may be exposed to hepatitis A.  Hepatitis B vaccine. You may need this if you have certain conditions or if you travel or work in  places where you may be exposed to hepatitis B.  Haemophilus influenzae type b (Hib) vaccine. You may need this if you have certain risk factors.  Talk to your health care provider about which screenings and vaccines you need and how often you need them. This information is not intended to replace advice given to you by your health care provider. Make sure you discuss any questions you have with your health care provider. Document Released: 08/01/2015 Document Revised: 03/24/2016 Document Reviewed: 05/06/2015 Elsevier Interactive Patient Education  [employer].

## 2017-10-20 NOTE — Telephone Encounter (Signed)
Closed

## 2017-11-09 ENCOUNTER — Ambulatory Visit (INDEPENDENT_AMBULATORY_CARE_PROVIDER_SITE_OTHER): Payer: Medicare Other | Admitting: Family Medicine

## 2017-11-09 ENCOUNTER — Encounter: Payer: Self-pay | Admitting: Family Medicine

## 2017-11-09 VITALS — BP 118/62 | HR 66 | Temp 98.2°F | Resp 16 | Wt 140.0 lb

## 2017-11-09 DIAGNOSIS — H9312 Tinnitus, left ear: Secondary | ICD-10-CM | POA: Diagnosis not present

## 2017-11-09 DIAGNOSIS — H6121 Impacted cerumen, right ear: Secondary | ICD-10-CM

## 2017-11-09 DIAGNOSIS — H9192 Unspecified hearing loss, left ear: Secondary | ICD-10-CM

## 2017-11-09 NOTE — Patient Instructions (Signed)
Tinnitus Tinnitus refers to hearing a sound when there is no actual source for that sound. This is often described as ringing in the ears. However, people with this condition may hear a variety of noises. A person may hear the sound in one ear or in both ears. The sounds of tinnitus can be soft, loud, or somewhere in between. Tinnitus can last for a few seconds or can be constant for days. It may go away without treatment and come back at various times. When tinnitus is constant or happens often, it can lead to other problems, such as trouble sleeping and trouble concentrating. Almost everyone experiences tinnitus at some point. Tinnitus that is long-lasting (chronic) or comes back often is a problem that may require medical attention. What are the causes? The cause of tinnitus is often not known. In some cases, it can result from other problems or conditions, including:  Exposure to loud noises from machinery, music, or other sources.  Hearing loss.  Ear or sinus infections.  Earwax buildup.  A foreign object in the ear.  Use of certain medicines.  Use of alcohol and caffeine.  High blood pressure.  Heart diseases.  Anemia.  Allergies.  Meniere disease.  Thyroid problems.  Tumors.  An enlarged part of a weakened blood vessel (aneurysm).  What are the signs or symptoms? The main symptom of tinnitus is hearing a sound when there is no source for that sound. It may sound like:  Buzzing.  Roaring.  Ringing.  Blowing air, similar to the sound heard when you listen to a seashell.  Hissing.  Whistling.  Sizzling.  Humming.  Running water.  A sustained musical note.  How is this diagnosed? Tinnitus is diagnosed based on your symptoms. Your health care provider will do a physical exam. A comprehensive hearing exam (audiologic exam) will be done if your tinnitus:  Affects only one ear (unilateral).  Causes hearing difficulties.  Lasts 6 months or  longer.  You may also need to see a health care provider who specializes in hearing disorders (audiologist). You may be asked to complete a questionnaire to determine the severity of your tinnitus. Tests may be done to help determine the cause and to rule out other conditions. These can include:  Imaging studies of your head and brain, such as: ? A CT scan. ? An MRI.  An imaging study of your blood vessels (angiogram).  How is this treated? Treating an underlying medical condition can sometimes make tinnitus go away. If your tinnitus continues, other treatments may include:  Medicines, such as certain antidepressants or sleeping aids.  Sound generators to mask the tinnitus. These include: ? Tabletop sound machines that play relaxing sounds to help you fall asleep. ? Wearable devices that fit in your ear and play sounds or music. ? A small device that uses headphones to deliver a signal embedded in music (acoustic neural stimulation). In time, this may change the pathways of your brain and make you less sensitive to tinnitus. This device is used for very severe cases when no other treatment is working.  Therapy and counseling to help you manage the stress of living with tinnitus.  Using hearing aids or cochlear implants, if your tinnitus is related to hearing loss.  Follow these instructions at home:  When possible, avoid being in loud places and being exposed to loud sounds.  Wear hearing protection, such as earplugs, when you are exposed to loud noises.  Do not take stimulants, such as nicotine,   alcohol, or caffeine.  Practice techniques for reducing stress, such as meditation, yoga, or deep breathing.  Use a white noise machine, a humidifier, or other devices to mask the sound of tinnitus.  Sleep with your head slightly raised. This may reduce the impact of tinnitus.  Try to get plenty of rest each night. Contact a health care provider if:  You have tinnitus in just one  ear.  Your tinnitus continues for 3 weeks or longer without stopping.  Home care measures are not helping.  You have tinnitus after a head injury.  You have tinnitus along with any of the following: ? Dizziness. ? Loss of balance. ? Nausea and vomiting. This information is not intended to replace advice given to you by your health care provider. Make sure you discuss any questions you have with your health care provider. Document Released: 07/05/2005 Document Revised: 03/07/2016 Document Reviewed: 12/05/2013 Elsevier Interactive Patient Education  2018 Elsevier Inc.  

## 2017-11-09 NOTE — Progress Notes (Signed)
Patient: Bruce Moody Male    DOB: 14-Nov-1950   67 y.o.   MRN: [id number] Visit Date: 11/09/2017  Today's Provider: Lavon Paganini, MD   I, Martha Clan, CMA, am acting as scribe for Lavon Paganini, MD.  Chief Complaint  Patient presents with  . Tinnitus   Subjective:    HPI   Pt states he noticed tinnitus in his left ear this morning. This is associated with hearing loss. Denies ear pain or discharge. Also denies dizziness. This problem is unchanged. He has had cerumen impaction of R ear previously.  No Known Allergies   Current Outpatient Medications:  .  acetaminophen (TYLENOL) 500 MG tablet, Take 1,000 mg by mouth every 6 (six) hours as needed (for pain.)., Disp: , Rfl:  .  aspirin EC 81 MG tablet, Take 81 mg by mouth daily., Disp: , Rfl:  .  atorvastatin (LIPITOR) 40 MG tablet, Take 40 mg by mouth at bedtime. , Disp: , Rfl:  .  carvedilol (COREG) 3.125 MG tablet, Take 3.125 mg by mouth 2 (two) times daily with a meal. , Disp: , Rfl:  .  clopidogrel (PLAVIX) 75 MG tablet, Take 75 mg by mouth daily. , Disp: , Rfl:  .  ibuprofen (ADVIL,MOTRIN) 200 MG tablet, Take 200 mg by mouth every 8 (eight) hours as needed (for pain.)., Disp: , Rfl:  .  ramipril (ALTACE) 2.5 MG capsule, Take 2.5 mg by mouth at bedtime. , Disp: , Rfl:   Review of Systems  Constitutional: Negative for activity change, appetite change, chills, diaphoresis, fatigue, fever and unexpected weight change.  HENT: Positive for hearing loss, postnasal drip and tinnitus. Negative for congestion, ear discharge, ear pain, rhinorrhea, sinus pressure and sinus pain.   Neurological: Negative for dizziness and headaches.    Social History   Tobacco Use  . Smoking status: Former Smoker    Types: Cigarettes    Last attempt to quit: 07/19/1996    Years since quitting: 21.3  . Smokeless tobacco: Never Used  Substance Use Topics  . Alcohol use: Yes    Alcohol/week: 3.0 - 3.6 oz    Types: 5 - 6 Glasses of  wine per week   Objective:   BP 118/62 (BP Location: Left Arm, Patient Position: Sitting, Cuff Size: Normal)   Pulse 66   Temp 98.2 F (36.8 C) (Oral)   Resp 16   Wt 140 lb (63.5 kg)   SpO2 99%   BMI 25.61 kg/m  Vitals:   11/09/17 1031  BP: 118/62  Pulse: 66  Resp: 16  Temp: 98.2 F (36.8 C)  TempSrc: Oral  SpO2: 99%  Weight: 140 lb (63.5 kg)     Physical Exam  Constitutional: He is oriented to person, place, and time. He appears well-developed and well-nourished. No distress.  HENT:  Head: Normocephalic and atraumatic.  Right Ear: No decreased hearing is noted.  Left Ear: Tympanic membrane, external ear and ear canal normal. No tenderness. No foreign bodies. Tympanic membrane is not injected, not scarred, not perforated, not erythematous, not retracted and not bulging.  No middle ear effusion. Decreased hearing is noted.  Mouth/Throat: Uvula is midline, oropharynx is clear and moist and mucous membranes are normal.  Cerumen impaction of R ear  Eyes: Pupils are equal, round, and reactive to light. Conjunctivae and EOM are normal. Right eye exhibits no discharge. Left eye exhibits no discharge. No scleral icterus.  Neck: Neck supple. No thyromegaly present.  Cardiovascular: Normal rate,  regular rhythm and normal heart sounds.  No murmur heard. Pulmonary/Chest: Effort normal and breath sounds normal. No respiratory distress. He has no wheezes. He has no rales.  Musculoskeletal: He exhibits no edema.  Lymphadenopathy:    He has no cervical adenopathy.  Neurological: He is alert and oriented to person, place, and time. He has normal strength. He displays no tremor. No cranial nerve deficit or sensory deficit. He exhibits normal muscle tone. He displays a negative Romberg sign. Coordination and gait normal.  Decreased hearing of L ear  Skin: Skin is warm and dry. Capillary refill takes less than 2 seconds.  Psychiatric: He has a normal mood and affect. His behavior is normal.    Vitals reviewed.     Assessment & Plan:  1. Tinnitus of left ear 2. Hearing loss of left ear, unspecified hearing loss type - with hearing loss and tinnitus without cerumen impaction or other abnormality on exam and neuro intact, needs ENT referral for audiogram - as he is otherwise neuro intact, does not need brain imaging at this time - if vertigo develops, could be Menieres - he will call if this develops - return precautions discussed - Ambulatory referral to ENT  3. Impacted cerumen of right ear - CMA irrigated R ear and removed some of cerumen before leaving clinic today   Return if symptoms worsen or fail to improve.   The entirety of the information documented in the History of Present Illness, Review of Systems and Physical Exam were personally obtained by me. Portions of this information were initially documented by Raquel Sarna Ratchford, CMA and reviewed by me for thoroughness and accuracy.    Virginia Crews, MD, MPH [facility] 11/09/2017 10:50 AM

## 2017-11-16 DIAGNOSIS — D0439 Carcinoma in situ of skin of other parts of face: Secondary | ICD-10-CM | POA: Diagnosis not present

## 2017-12-01 DIAGNOSIS — D2272 Melanocytic nevi of left lower limb, including hip: Secondary | ICD-10-CM | POA: Diagnosis not present

## 2017-12-01 DIAGNOSIS — D485 Neoplasm of uncertain behavior of skin: Secondary | ICD-10-CM | POA: Diagnosis not present

## 2017-12-01 DIAGNOSIS — Z85828 Personal history of other malignant neoplasm of skin: Secondary | ICD-10-CM | POA: Diagnosis not present

## 2017-12-01 DIAGNOSIS — D2262 Melanocytic nevi of left upper limb, including shoulder: Secondary | ICD-10-CM | POA: Diagnosis not present

## 2017-12-01 DIAGNOSIS — D2261 Melanocytic nevi of right upper limb, including shoulder: Secondary | ICD-10-CM | POA: Diagnosis not present

## 2017-12-01 DIAGNOSIS — Z85820 Personal history of malignant melanoma of skin: Secondary | ICD-10-CM | POA: Diagnosis not present

## 2017-12-01 DIAGNOSIS — D2271 Melanocytic nevi of right lower limb, including hip: Secondary | ICD-10-CM | POA: Diagnosis not present

## 2017-12-01 DIAGNOSIS — D225 Melanocytic nevi of trunk: Secondary | ICD-10-CM | POA: Diagnosis not present

## 2017-12-01 DIAGNOSIS — Z08 Encounter for follow-up examination after completed treatment for malignant neoplasm: Secondary | ICD-10-CM | POA: Diagnosis not present

## 2017-12-15 DIAGNOSIS — I25810 Atherosclerosis of coronary artery bypass graft(s) without angina pectoris: Secondary | ICD-10-CM | POA: Diagnosis not present

## 2017-12-15 DIAGNOSIS — I10 Essential (primary) hypertension: Secondary | ICD-10-CM | POA: Diagnosis not present

## 2017-12-15 DIAGNOSIS — Z125 Encounter for screening for malignant neoplasm of prostate: Secondary | ICD-10-CM | POA: Diagnosis not present

## 2017-12-16 ENCOUNTER — Telehealth: Payer: Self-pay | Admitting: *Deleted

## 2017-12-16 LAB — COMPREHENSIVE METABOLIC PANEL
A/G RATIO: 1.8 (ref 1.2–2.2)
ALBUMIN: 4.4 g/dL (ref 3.6–4.8)
ALT: 26 IU/L (ref 0–44)
AST: 29 IU/L (ref 0–40)
Alkaline Phosphatase: 95 IU/L (ref 39–117)
BILIRUBIN TOTAL: 0.7 mg/dL (ref 0.0–1.2)
BUN / CREAT RATIO: 13 (ref 10–24)
BUN: 12 mg/dL (ref 8–27)
CHLORIDE: 105 mmol/L (ref 96–106)
CO2: 22 mmol/L (ref 20–29)
Calcium: 9.1 mg/dL (ref 8.6–10.2)
Creatinine, Ser: 0.93 mg/dL (ref 0.76–1.27)
GFR calc Af Amer: 97 mL/min/{1.73_m2} (ref >59)
GFR calc non Af Amer: 84 mL/min/{1.73_m2} (ref >59)
GLOBULIN, TOTAL: 2.5 g/dL (ref 1.5–4.5)
Glucose: 97 mg/dL (ref 65–99)
POTASSIUM: 4.3 mmol/L (ref 3.5–5.2)
SODIUM: 142 mmol/L (ref 134–144)
TOTAL PROTEIN: 6.9 g/dL (ref 6.0–8.5)

## 2017-12-16 LAB — LIPID PANEL
Chol/HDL Ratio: 3 ratio (ref 0.0–5.0)
Cholesterol, Total: 151 mg/dL (ref 100–199)
HDL: 51 mg/dL (ref >39)
LDL Calculated: 84 mg/dL (ref 0–99)
Triglycerides: 82 mg/dL (ref 0–149)
VLDL Cholesterol Cal: 16 mg/dL (ref 5–40)

## 2017-12-16 LAB — PSA: Prostate Specific Ag, Serum: 1.8 ng/mL (ref 0.0–4.0)

## 2017-12-16 MED ORDER — ATORVASTATIN CALCIUM 80 MG PO TABS: 80.0000 mg | ORAL_TABLET | Freq: Every day | ORAL | 3 refills | Status: AC

## 2017-12-16 NOTE — Telephone Encounter (Signed)
Patient was notified of results. Expressed understanding. Rx sent to pharmacy. F/u scheduled.

## 2017-12-16 NOTE — Telephone Encounter (Signed)
LMOVM for pt to return call 

## 2017-12-16 NOTE — Telephone Encounter (Signed)
-----   Message from Birdie Sons, MD sent at 12/16/2017 10:08 AM EDT ----- LDL cholesterol is 84, needs to  Be under 70, need to increase atorvastatin to 80mg  daily, #90 rf x 3. Follow up for medications and labs in 3 months.  Rest of labs are good.

## 2018-01-04 ENCOUNTER — Other Ambulatory Visit: Payer: Self-pay | Admitting: Family Medicine

## 2018-01-04 MED ORDER — SILDENAFIL CITRATE 20 MG PO TABS: ORAL_TABLET | ORAL | 5 refills | Status: DC

## 2018-01-04 NOTE — Progress Notes (Signed)
Patient left letter requesting prescription for sildenafil 20mg  #30 be sent to [facility]

## 2018-03-17 ENCOUNTER — Encounter: Payer: Self-pay | Admitting: Family Medicine

## 2018-03-17 ENCOUNTER — Ambulatory Visit (INDEPENDENT_AMBULATORY_CARE_PROVIDER_SITE_OTHER): Payer: Medicare Other | Admitting: Family Medicine

## 2018-03-17 VITALS — BP 123/76 | HR 64 | Temp 99.1°F | Resp 16 | Ht 62.0 in | Wt 140.0 lb

## 2018-03-17 DIAGNOSIS — I25810 Atherosclerosis of coronary artery bypass graft(s) without angina pectoris: Secondary | ICD-10-CM | POA: Diagnosis not present

## 2018-03-17 NOTE — Progress Notes (Signed)
Patient: Bruce Moody Male    DOB: Nov 04, 1950   67 y.o.   MRN: [id number] Visit Date: 03/17/2018  Today's Provider: Lelon Huh, MD   Chief Complaint  Patient presents with  . Hypertension  . Hyperlipidemia  . Coronary Artery Disease   Subjective:    HPI   Hypertension, follow-up:  BP Readings from Last 3 Encounters:  03/17/18 123/76  11/09/17 118/62  10/04/17 98/60    He was last seen for hypertension 5 months ago.  BP at that visit was 98/60. Management since that visit includes; no changes.He reports good compliance with treatment. He is not having side effects.  He is not exercising. He is not adherent to low salt diet.   Outside blood pressures are checked occasionally and average 110-120/ 60. He is experiencing none.  Patient denies chest pain, chest pressure/discomfort, claudication, dyspnea, exertional chest pressure/discomfort, fatigue, irregular heart beat, lower extremity edema, near-syncope, orthopnea, palpitations, paroxysmal nocturnal dyspnea, syncope and tachypnea.   Cardiovascular risk factors include dyslipidemia, hypertension and male gender.  Use of agents associated with hypertension: NSAIDS.   ------------------------------------------------------------------------    Lipid/Cholesterol, Follow-up:   Last seen for this 5 months ago.  Management since that visit includes; increased atorvastatin to 80 mg qd.  Last Lipid Panel:    Component Value Date/Time   CHOL 151 12/15/2017 0817   TRIG 82 12/15/2017 0817   HDL 51 12/15/2017 0817   CHOLHDL 3.0 12/15/2017 0817   LDLCALC 84 12/15/2017 0817    He reports good compliance with treatment. He is not having side effects.   Wt Readings from Last 3 Encounters:  03/17/18 140 lb (63.5 kg)  11/09/17 140 lb (63.5 kg)  08/13/16 136 lb (61.7 kg)    ------------------------------------------------------------------------  CAD From 10/04/2017-no changes. Continues to follow up with  cardiology. Has appointment with Dr. Nehemiah Massed in September. Denies any chest pains or dyspnea.   No Known Allergies   Current Outpatient Medications:  .  acetaminophen (TYLENOL) 500 MG tablet, Take 1,000 mg by mouth every 6 (six) hours as needed (for pain.)., Disp: , Rfl:  .  aspirin EC 81 MG tablet, Take 81 mg by mouth daily., Disp: , Rfl:  .  atorvastatin (LIPITOR) 80 MG tablet, Take 1 tablet (80 mg total) by mouth daily., Disp: 90 tablet, Rfl: 3 .  carvedilol (COREG) 3.125 MG tablet, Take 3.125 mg by mouth 2 (two) times daily with a meal. , Disp: , Rfl:  .  clopidogrel (PLAVIX) 75 MG tablet, Take 75 mg by mouth daily. , Disp: , Rfl:  .  ibuprofen (ADVIL,MOTRIN) 200 MG tablet, Take 200 mg by mouth every 8 (eight) hours as needed (for pain.)., Disp: , Rfl:  .  ramipril (ALTACE) 2.5 MG capsule, Take 2.5 mg by mouth at bedtime. , Disp: , Rfl:  .  sildenafil (REVATIO) 20 MG tablet, 3-5 tablets as needed prior to intercourse, not to exceed 5 tablets in a day, Disp: 30 tablet, Rfl: 5  Review of Systems  Constitutional: Negative for appetite change, chills and fever.  Respiratory: Negative for chest tightness, shortness of breath and wheezing.   Cardiovascular: Negative for chest pain and palpitations.  Gastrointestinal: Negative for abdominal pain, nausea and vomiting.  Musculoskeletal: Positive for arthralgias.    Social History   Tobacco Use  . Smoking status: Former Smoker    Types: Cigarettes    Last attempt to quit: 07/19/1996    Years since quitting: 21.6  . Smokeless  tobacco: Never Used  Substance Use Topics  . Alcohol use: Yes    Alcohol/week: 5.0 - 6.0 standard drinks    Types: 5 - 6 Glasses of wine per week   Objective:   BP 123/76 (BP Location: Left Arm, Patient Position: Sitting, Cuff Size: Normal)   Pulse 64   Temp 99.1 F (37.3 C) (Oral)   Resp 16   Ht 5\' 2"  (1.575 m)   Wt 140 lb (63.5 kg)   SpO2 98% Comment: room air  BMI 25.61 kg/m  Vitals:   03/17/18 0826    BP: 123/76  Pulse: 64  Resp: 16  Temp: 99.1 F (37.3 C)  TempSrc: Oral  SpO2: 98%  Weight: 140 lb (63.5 kg)  Height: 5\' 2"  (1.575 m)     Physical Exam  General appearance: alert, well developed, well nourished, cooperative and in no distress Head: Normocephalic, without obvious abnormality, atraumatic Respiratory: Respirations even and unlabored, normal respiratory rate Extremities: No gross deformities Skin: Skin color, texture, turgor normal. No rashes seen  Psych: Appropriate mood and affect. Neurologic: Mental status: Alert, oriented to person, place, and time, thought content appropriate.     Assessment & Plan:     1. Atherosclerosis of coronary artery bypass graft of native heart without angina pectoris Asymptomatic. Compliant with medication.  Continue aggressive risk factor modification.  Doing well with increased dose of atorvastatin.  - Comprehensive metabolic panel - Lipid panel       Lelon Huh, MD  [facility]

## 2018-03-17 NOTE — Patient Instructions (Signed)
   The CDC recommends two doses of Shingrix (the shingles vaccine) separated by 2 to 6 months for adults age 69 years and older. I recommend checking with your insurance plan regarding coverage for this vaccine.   

## 2018-03-18 LAB — COMPREHENSIVE METABOLIC PANEL
A/G RATIO: 1.9 (ref 1.2–2.2)
ALT: 29 IU/L (ref 0–44)
AST: 30 IU/L (ref 0–40)
Albumin: 4.4 g/dL (ref 3.6–4.8)
Alkaline Phosphatase: 88 IU/L (ref 39–117)
BILIRUBIN TOTAL: 0.9 mg/dL (ref 0.0–1.2)
BUN/Creatinine Ratio: 14 (ref 10–24)
BUN: 13 mg/dL (ref 8–27)
CALCIUM: 9.2 mg/dL (ref 8.6–10.2)
CO2: 23 mmol/L (ref 20–29)
Chloride: 102 mmol/L (ref 96–106)
Creatinine, Ser: 0.91 mg/dL (ref 0.76–1.27)
GFR calc Af Amer: 99 mL/min/{1.73_m2} (ref >59)
GFR, EST NON AFRICAN AMERICAN: 86 mL/min/{1.73_m2} (ref >59)
Globulin, Total: 2.3 g/dL (ref 1.5–4.5)
Glucose: 86 mg/dL (ref 65–99)
POTASSIUM: 4.4 mmol/L (ref 3.5–5.2)
SODIUM: 140 mmol/L (ref 134–144)
Total Protein: 6.7 g/dL (ref 6.0–8.5)

## 2018-03-18 LAB — LIPID PANEL
CHOL/HDL RATIO: 2.8 ratio (ref 0.0–5.0)
Cholesterol, Total: 137 mg/dL (ref 100–199)
HDL: 49 mg/dL (ref >39)
LDL CALC: 72 mg/dL (ref 0–99)
Triglycerides: 79 mg/dL (ref 0–149)
VLDL Cholesterol Cal: 16 mg/dL (ref 5–40)

## 2018-03-30 DIAGNOSIS — E782 Mixed hyperlipidemia: Secondary | ICD-10-CM | POA: Diagnosis not present

## 2018-03-30 DIAGNOSIS — I25810 Atherosclerosis of coronary artery bypass graft(s) without angina pectoris: Secondary | ICD-10-CM | POA: Diagnosis not present

## 2018-03-30 DIAGNOSIS — I10 Essential (primary) hypertension: Secondary | ICD-10-CM | POA: Diagnosis not present

## 2018-05-03 ENCOUNTER — Ambulatory Visit (INDEPENDENT_AMBULATORY_CARE_PROVIDER_SITE_OTHER): Payer: Medicare Other

## 2018-05-03 DIAGNOSIS — Z23 Encounter for immunization: Secondary | ICD-10-CM | POA: Diagnosis not present

## 2018-05-12 DIAGNOSIS — M25551 Pain in right hip: Secondary | ICD-10-CM | POA: Diagnosis not present

## 2018-05-12 DIAGNOSIS — M1610 Unilateral primary osteoarthritis, unspecified hip: Secondary | ICD-10-CM | POA: Diagnosis not present

## 2018-06-08 DIAGNOSIS — X32XXXA Exposure to sunlight, initial encounter: Secondary | ICD-10-CM | POA: Diagnosis not present

## 2018-06-08 DIAGNOSIS — Z85828 Personal history of other malignant neoplasm of skin: Secondary | ICD-10-CM | POA: Diagnosis not present

## 2018-06-08 DIAGNOSIS — Z85820 Personal history of malignant melanoma of skin: Secondary | ICD-10-CM | POA: Diagnosis not present

## 2018-06-08 DIAGNOSIS — L570 Actinic keratosis: Secondary | ICD-10-CM | POA: Diagnosis not present

## 2018-06-08 DIAGNOSIS — Z08 Encounter for follow-up examination after completed treatment for malignant neoplasm: Secondary | ICD-10-CM | POA: Diagnosis not present

## 2018-09-28 DIAGNOSIS — E782 Mixed hyperlipidemia: Secondary | ICD-10-CM | POA: Diagnosis not present

## 2018-09-28 DIAGNOSIS — I25810 Atherosclerosis of coronary artery bypass graft(s) without angina pectoris: Secondary | ICD-10-CM | POA: Diagnosis not present

## 2018-09-28 DIAGNOSIS — I255 Ischemic cardiomyopathy: Secondary | ICD-10-CM | POA: Diagnosis not present

## 2018-09-28 DIAGNOSIS — I10 Essential (primary) hypertension: Secondary | ICD-10-CM | POA: Diagnosis not present

## 2018-09-28 DIAGNOSIS — I739 Peripheral vascular disease, unspecified: Secondary | ICD-10-CM | POA: Diagnosis not present

## 2018-09-28 DIAGNOSIS — Z87891 Personal history of nicotine dependence: Secondary | ICD-10-CM | POA: Diagnosis not present

## 2018-10-05 ENCOUNTER — Ambulatory Visit: Payer: Medicare Other

## 2018-10-06 ENCOUNTER — Ambulatory Visit: Payer: Medicare Other

## 2018-10-06 ENCOUNTER — Other Ambulatory Visit: Payer: Self-pay

## 2018-10-06 ENCOUNTER — Ambulatory Visit (INDEPENDENT_AMBULATORY_CARE_PROVIDER_SITE_OTHER): Payer: Medicare Other

## 2018-10-06 VITALS — BP 114/66 | HR 63 | Temp 98.8°F | Ht 62.0 in | Wt 141.2 lb

## 2018-10-06 DIAGNOSIS — Z0000 Encounter for general adult medical examination without abnormal findings: Secondary | ICD-10-CM | POA: Diagnosis not present

## 2018-10-06 DIAGNOSIS — Z23 Encounter for immunization: Secondary | ICD-10-CM | POA: Diagnosis not present

## 2018-10-06 NOTE — Progress Notes (Signed)
Subjective:   Bruce Moody is a 68 y.o. male who presents for Medicare Annual/Subsequent preventive examination.  Review of Systems:  N/A  Cardiac Risk Factors include: advanced age (>79men, >29 women);hypertension;male gender;dyslipidemia     Objective:    Vitals: BP 114/66 (BP Location: Right Arm)    Pulse 63    Temp 98.8 F (37.1 C) (Oral)    Ht 5\' 2"  (1.575 m)    Wt 141 lb 3.2 oz (64 kg)    BMI 25.83 kg/m   Body mass index is 25.83 kg/m.  Advanced Directives 10/06/2018 10/04/2017 06/08/2016 05/26/2016  Does Patient Have a Medical Advance Directive? Yes Yes Yes Yes  Type of Paramedic of [place];Living will [place];Living will [place];Living will [place];Living will  Does patient want to make changes to medical advance directive? - - No - Patient declined -  Copy of [place] in Chart? No - copy requested No - copy requested - -    Tobacco Social History   Tobacco Use  Smoking Status Former Smoker   Types: Cigarettes   Last attempt to quit: 07/19/1996   Years since quitting: 22.2  Smokeless Tobacco Never Used     Counseling given: Not Answered   Clinical Intake:  Pre-visit preparation completed: Yes  Pain : No/denies pain Pain Score: 0-No pain     Nutritional Status: BMI 25 -29 Overweight Nutritional Risks: None Diabetes: No  How often do you need to have someone help you when you read instructions, pamphlets, or other written materials from your doctor or pharmacy?: 1 - Never  Interpreter Needed?: No  Information entered by :: [facility], LPN  Past Medical History:  Diagnosis Date   Arthritis    right hip   Cancer ([place]) 2011   Melanoma   Coronary artery disease    Elevated lipids    Heart disease    Hypertension    Myocardial infarction [facility])    Past Surgical History:  Procedure Laterality Date   BASAL CELL CARCINOMA EXCISION       right eyelid   CARDIAC CATHETERIZATION     COLONOSCOPY  2004   CORONARY ANGIOPLASTY     CORONARY ARTERY BYPASS GRAFT  1998   CORONARY ARTERY BYPASS GRAFT     3 vessels   HERNIA REPAIR Left 2008   inguinal   HERNIA REPAIR Right 07-05-13   inguinal    ROTATOR CUFF REPAIR  2005   TONSILLECTOMY     TOTAL HIP ARTHROPLASTY Left 06/08/2016   Procedure: TOTAL HIP ARTHROPLASTY ANTERIOR APPROACH;  Surgeon: Hessie Knows, MD;  Location: [facility] ORS;  Service: Orthopedics;  Laterality: Left;   Family History  Problem Relation Age of Onset   Lymphoma Father    Heart disease Mother    Dementia Mother    Arthritis Sister    Heart disease Sister    COPD Sister    Social History   Socioeconomic History   Marital status: Married    Spouse name: Not on file   Number of children: 1   Years of education: Not on file   Highest education level: Bachelor's degree (e.g., BA, AB, BS)  Occupational History   Not on file  Social Needs   Financial resource strain: Not hard at all   Food insecurity:    Worry: Never true    Inability: Never true   Transportation needs:    Medical: No  Non-medical: No  Tobacco Use   Smoking status: Former Smoker    Types: Cigarettes    Last attempt to quit: 07/19/1996    Years since quitting: 22.2   Smokeless tobacco: Never Used  Substance and Sexual Activity   Alcohol use: Yes    Alcohol/week: 5.0 - 6.0 standard drinks    Types: 5 - 6 Glasses of wine per week   Drug use: No   Sexual activity: Not on file  Lifestyle   Physical activity:    Days per week: 0 days    Minutes per session: 0 min   Stress: Not at all  Relationships   Social connections:    Talks on phone: Patient refused    Gets together: Patient refused    Attends religious service: Patient refused    Active member of club or organization: Patient refused    Attends meetings of clubs or organizations: Patient refused    Relationship status: Patient refused   Other Topics Concern   Not on file  Social History Narrative   Not on file    Outpatient Encounter Medications as of 10/06/2018  Medication Sig   acetaminophen (TYLENOL) 500 MG tablet Take 1,000 mg by mouth every 6 (six) hours as needed (for pain.).   aspirin EC 81 MG tablet Take 81 mg by mouth daily.   atorvastatin (LIPITOR) 80 MG tablet Take 1 tablet (80 mg total) by mouth daily.   carvedilol (COREG) 3.125 MG tablet Take 3.125 mg by mouth 2 (two) times daily with a meal.    clopidogrel (PLAVIX) 75 MG tablet Take 75 mg by mouth daily.    ibuprofen (ADVIL,MOTRIN) 200 MG tablet Take 200 mg by mouth every 8 (eight) hours as needed (for pain.).   ramipril (ALTACE) 2.5 MG capsule Take 2.5 mg by mouth at bedtime.    sildenafil (REVATIO) 20 MG tablet 3-5 tablets as needed prior to intercourse, not to exceed 5 tablets in a day   No facility-administered encounter medications on file as of 10/06/2018.     Activities of Daily Living In your present state of health, do you have any difficulty performing the following activities: 10/06/2018  Hearing? N  Vision? N  Difficulty concentrating or making decisions? N  Walking or climbing stairs? N  Dressing or bathing? N  Doing errands, shopping? N  Preparing Food and eating ? N  Using the Toilet? N  In the past six months, have you accidently leaked urine? N  Do you have problems with loss of bowel control? N  Managing your Medications? N  Managing your Finances? N  Housekeeping or managing your Housekeeping? N  Some recent data might be hidden    Patient Care Team: Birdie Sons, MD as PCP - General (Family Medicine) Corey Skains, MD as Consulting Physician (Cardiology) Oneta Rack, MD as Consulting Physician (Dermatology) Hessie Knows, MD as Consulting Physician (Orthopedic Surgery) Oleta Mouse as Physician Assistant (Cardiology)   Assessment:   This is a routine wellness examination for  Bruce Moody.  Exercise Activities and Dietary recommendations Current Exercise Habits: The patient does not participate in regular exercise at present, Exercise limited by: orthopedic condition(s)  Goals     DIET - INCREASE WATER INTAKE     Recommend increasing water intake to 3 glasses or more a day.        Fall Risk: Fall Risk  10/06/2018 10/04/2017  Falls in the past year? 0 No    FALL RISK PREVENTION PERTAINING  TO THE HOME:  Any stairs in or around the home? Yes  If so, are there any without handrails? Yes   Home free of loose throw rugs in walkways, pet beds, electrical cords, etc? Yes  Adequate lighting in your home to reduce risk of falls? Yes   ASSISTIVE DEVICES UTILIZED TO PREVENT FALLS:  Life alert? No  Use of a cane, walker or w/c? No  Grab bars in the bathroom? No  Shower chair or bench in shower? No  Elevated toilet seat or a handicapped toilet? No   TIMED UP AND GO:  Was the test performed? No .    Depression Screen PHQ 2/9 Scores 10/06/2018 10/04/2017  PHQ - 2 Score 0 0    Cognitive Function     6CIT Screen 10/06/2018  What Year? 0 points  What month? 0 points  What time? 0 points  Count back from 20 0 points  Months in reverse 0 points  Repeat phrase 0 points  Total Score 0    Immunization History  Administered Date(s) Administered   Influenza Split 05/12/2011, 05/18/2012   Influenza, High Dose Seasonal PF 06/05/2015, 05/20/2016, 05/04/2017, 05/03/2018   Influenza,inj,Quad PF,6+ Mos 05/16/2013, 05/16/2014   Pneumococcal Conjugate-13 10/04/2017   Pneumococcal Polysaccharide-23 10/06/2018   Tdap 02/20/2014    Qualifies for Shingles Vaccine? Yes . Due for Shingrix. Education has been provided regarding the importance of this vaccine. Pt has been advised to call insurance company to determine out of pocket expense. Advised may also receive vaccine at local pharmacy or Health Dept. Verbalized acceptance and understanding.  Tdap: Up to  date  Flu Vaccine: Up to date  Pneumococcal Vaccine: Up to date  Screening Tests Health Maintenance  Topic Date Due   TETANUS/TDAP  02/21/2024   COLONOSCOPY  05/28/2024   INFLUENZA VACCINE  Completed   Hepatitis C Screening  Completed   PNA vac Low Risk Adult  Completed   Cancer Screenings:  Colorectal Screening: Completed 05/28/14. Repeat every 10 years.  Lung Cancer Screening: (Low Dose CT Chest recommended if Age 37-80 years, 30 pack-year currently smoking OR have quit w/in 15years.) does not qualify.   Additional Screening:  Hepatitis C Screening: Up to date  Vision Screening: Recommended annual ophthalmology exams for early detection of glaucoma and other disorders of the eye.  Dental Screening: Recommended annual dental exams for proper oral hygiene  Community Resource Referral:  CRR required this visit?  No        Plan:  I have personally reviewed and addressed the Medicare Annual Wellness questionnaire and have noted the following in the patients chart:  A. Medical and social history B. Use of alcohol, tobacco or illicit drugs  C. Current medications and supplements D. Functional ability and status E.  Nutritional status F.  Physical activity G. Advance directives H. List of other physicians I.  Hospitalizations, surgeries, and ER visits in previous 12 months J.  [place] such as hearing and vision if needed, cognitive and depression L. Referrals and appointments   In addition, I have reviewed and discussed with patient certain preventive protocols, quality metrics, and best practice recommendations. A written personalized care plan for preventive services as well as general preventive health recommendations were provided to patient.   Glendora Score, LPN  8/93/8101 Nurse Health Advisor   Nurse Notes: None.

## 2018-10-06 NOTE — Patient Instructions (Signed)
Bruce Moody , Thank you for taking time to come for your Medicare Wellness Visit. I appreciate your ongoing commitment to your health goals. Please review the following plan we discussed and let me know if I can assist you in the future.   Screening recommendations/referrals: Colonoscopy: Up to date, due 05/2024 Recommended yearly ophthalmology/optometry visit for glaucoma screening and checkup Recommended yearly dental visit for hygiene and checkup  Vaccinations: Influenza vaccine: Up to date Pneumococcal vaccine: Updated today.  Tdap vaccine: Up to date, due 02/2024 Shingles vaccine: Pt declines today.     Advanced directives: Please bring a copy of your POA (Power of Attorney) and/or Living Will to your next appointment.   Conditions/risks identified: Continue increasing water intake to 6-8 8 oz glasses a day.   Next appointment: Pt declined scheduling a CPE for this year or an AWV for next year.   Preventive Care 13 Years and Older, Male Preventive care refers to lifestyle choices and visits with your health care provider that can promote health and wellness. What does preventive care include?  A yearly physical exam. This is also called an annual well check.  Dental exams once or twice a year.  Routine eye exams. Ask your health care provider how often you should have your eyes checked.  Personal lifestyle choices, including:  Daily care of your teeth and gums.  Regular physical activity.  Eating a healthy diet.  Avoiding tobacco and drug use.  Limiting alcohol use.  Practicing safe sex.  Taking low doses of aspirin every day.  Taking vitamin and mineral supplements as recommended by your health care provider. What happens during an annual well check? The services and screenings done by your health care provider during your annual well check will depend on your age, overall health, lifestyle risk factors, and family history of disease. Counseling  Your health  care provider may ask you questions about your:  Alcohol use.  Tobacco use.  Drug use.  Emotional well-being.  Home and relationship well-being.  Sexual activity.  Eating habits.  History of falls.  Memory and ability to understand (cognition).  Work and work Statistician. Screening  You may have the following tests or measurements:  Height, weight, and BMI.  Blood pressure.  Lipid and cholesterol levels. These may be checked every 5 years, or more frequently if you are over 49 years old.  Skin check.  Lung cancer screening. You may have this screening every year starting at age 33 if you have a 30-pack-year history of smoking and currently smoke or have quit within the past 15 years.  Fecal occult blood test (FOBT) of the stool. You may have this test every year starting at age 27.  Flexible sigmoidoscopy or colonoscopy. You may have a sigmoidoscopy every 5 years or a colonoscopy every 10 years starting at age 32.  Prostate cancer screening. Recommendations will vary depending on your family history and other risks.  Hepatitis C blood test.  Hepatitis B blood test.  Sexually transmitted disease (STD) testing.  Diabetes screening. This is done by checking your blood sugar (glucose) after you have not eaten for a while (fasting). You may have this done every 1-3 years.  Abdominal aortic aneurysm (AAA) screening. You may need this if you are a current or former smoker.  Osteoporosis. You may be screened starting at age 71 if you are at high risk. Talk with your health care provider about your test results, treatment options, and if necessary, the need for more  tests. Vaccines  Your health care provider [place] recommend certain vaccines, such as:  Influenza vaccine. This is recommended every year.  Tetanus, diphtheria, and acellular pertussis (Tdap, Td) vaccine. You Barb need a Td booster every 10 years.  Zoster vaccine. You [place] need this after age 21.   Pneumococcal 13-valent conjugate (PCV13) vaccine. One dose is recommended after age 68.  Pneumococcal polysaccharide (PPSV23) vaccine. One dose is recommended after age 79. Talk to your health care provider about which screenings and vaccines you need and how often you need them. This information is not intended to replace advice given to you by your health care provider. Make sure you discuss any questions you have with your health care provider. Document Released: 08/01/2015 Document Revised: 03/24/2016 Document Reviewed: 05/06/2015 Elsevier Interactive Patient Education  2017 [place] Prevention in the Home Falls can cause injuries. They can happen to people of all ages. There are many things you can do to make your home safe and to help prevent falls. What can I do on the outside of my home?  Regularly fix the edges of walkways and driveways and fix any cracks.  Remove anything that might make you trip as you walk through a door, such as a raised step or threshold.  Trim any bushes or trees on the path to your home.  Use bright outdoor lighting.  Clear any walking paths of anything that might make someone trip, such as rocks or tools.  Regularly check to see if handrails are loose or broken. Make sure that both sides of any steps have handrails.  Any raised decks and porches should have guardrails on the edges.  Have any leaves, snow, or ice cleared regularly.  Use sand or salt on walking paths during winter.  Clean up any spills in your garage right away. This includes oil or grease spills. What can I do in the bathroom?  Use night lights.  Install grab bars by the toilet and in the tub and shower. Do not use towel bars as grab bars.  Use non-skid mats or decals in the tub or shower.  If you need to sit down in the shower, use a plastic, non-slip stool.  Keep the floor dry. Clean up any water that spills on the floor as soon as it happens.  Remove soap  buildup in the tub or shower regularly.  Attach bath mats securely with double-sided non-slip rug tape.  Do not have throw rugs and other things on the floor that can make you trip. What can I do in the bedroom?  Use night lights.  Make sure that you have a light by your bed that is easy to reach.  Do not use any sheets or blankets that are too big for your bed. They should not hang down onto the floor.  Have a firm chair that has side arms. You can use this for support while you get dressed.  Do not have throw rugs and other things on the floor that can make you trip. What can I do in the kitchen?  Clean up any spills right away.  Avoid walking on wet floors.  Keep items that you use a lot in easy-to-reach places.  If you need to reach something above you, use a strong step stool that has a grab bar.  Keep electrical cords out of the way.  Do not use floor polish or wax that makes floors slippery. If you must use wax, use non-skid floor  wax.  Do not have throw rugs and other things on the floor that can make you trip. What can I do with my stairs?  Do not leave any items on the stairs.  Make sure that there are handrails on both sides of the stairs and use them. Fix handrails that are broken or loose. Make sure that handrails are as long as the stairways.  Check any carpeting to make sure that it is firmly attached to the stairs. Fix any carpet that is loose or worn.  Avoid having throw rugs at the top or bottom of the stairs. If you do have throw rugs, attach them to the floor with carpet tape.  Make sure that you have a light switch at the top of the stairs and the bottom of the stairs. If you do not have them, ask someone to add them for you. What else can I do to help prevent falls?  Wear shoes that:  Do not have high heels.  Have rubber bottoms.  Are comfortable and fit you well.  Are closed at the toe. Do not wear sandals.  If you use a stepladder:  Make  sure that it is fully opened. Do not climb a closed stepladder.  Make sure that both sides of the stepladder are locked into place.  Ask someone to hold it for you, if possible.  Clearly mark and make sure that you can see:  Any grab bars or handrails.  First and last steps.  Where the edge of each step is.  Use tools that help you move around (mobility aids) if they are needed. These include:  Canes.  Walkers.  Scooters.  Crutches.  Turn on the lights when you go into a dark area. Replace any light bulbs as soon as they burn out.  Set up your furniture so you have a clear path. Avoid moving your furniture around.  If any of your floors are uneven, fix them.  If there are any pets around you, be aware of where they are.  Review your medicines with your doctor. Some medicines can make you feel dizzy. This can increase your chance of falling. Ask your doctor what other things that you can do to help prevent falls. This information is not intended to replace advice given to you by your health care provider. Make sure you discuss any questions you have with your health care provider. Document Released: 05/01/2009 Document Revised: 12/11/2015 Document Reviewed: 08/09/2014 Elsevier Interactive Patient Education  2017 [employer].

## 2018-12-07 DIAGNOSIS — Z08 Encounter for follow-up examination after completed treatment for malignant neoplasm: Secondary | ICD-10-CM | POA: Diagnosis not present

## 2018-12-07 DIAGNOSIS — Z85828 Personal history of other malignant neoplasm of skin: Secondary | ICD-10-CM | POA: Diagnosis not present

## 2018-12-07 DIAGNOSIS — Z85820 Personal history of malignant melanoma of skin: Secondary | ICD-10-CM | POA: Diagnosis not present

## 2018-12-07 DIAGNOSIS — D692 Other nonthrombocytopenic purpura: Secondary | ICD-10-CM | POA: Diagnosis not present

## 2018-12-07 DIAGNOSIS — L570 Actinic keratosis: Secondary | ICD-10-CM | POA: Diagnosis not present

## 2018-12-07 DIAGNOSIS — D485 Neoplasm of uncertain behavior of skin: Secondary | ICD-10-CM | POA: Diagnosis not present

## 2018-12-07 DIAGNOSIS — X32XXXA Exposure to sunlight, initial encounter: Secondary | ICD-10-CM | POA: Diagnosis not present

## 2019-03-07 ENCOUNTER — Encounter: Payer: Self-pay | Admitting: Physician Assistant

## 2019-03-07 ENCOUNTER — Ambulatory Visit (INDEPENDENT_AMBULATORY_CARE_PROVIDER_SITE_OTHER): Payer: Medicare Other | Admitting: Physician Assistant

## 2019-03-07 VITALS — BP 123/74 | HR 85 | Temp 97.4°F | Wt 140.0 lb

## 2019-03-07 DIAGNOSIS — K529 Noninfective gastroenteritis and colitis, unspecified: Secondary | ICD-10-CM | POA: Diagnosis not present

## 2019-03-07 DIAGNOSIS — R197 Diarrhea, unspecified: Secondary | ICD-10-CM

## 2019-03-07 DIAGNOSIS — R112 Nausea with vomiting, unspecified: Secondary | ICD-10-CM | POA: Diagnosis not present

## 2019-03-07 MED ORDER — ONDANSETRON HCL 4 MG PO TABS: 4.0000 mg | ORAL_TABLET | Freq: Three times a day (TID) | ORAL | 0 refills | Status: DC | PRN

## 2019-03-07 MED ORDER — LOPERAMIDE HCL 2 MG PO TABS: 2.0000 mg | ORAL_TABLET | Freq: Four times a day (QID) | ORAL | 0 refills | Status: DC | PRN

## 2019-03-07 NOTE — Progress Notes (Signed)
Patient: Bruce Moody Male    DOB: 08-15-1950   68 y.o.   MRN: [id number] Visit Date: 03/07/2019  Today's Provider: Mar Daring, PA-C   Chief Complaint  Patient presents with  . Diarrhea   Subjective:      Virtual Visit via Telephone Note  I connected with [place] on 03/07/19 at 11:00 AM EDT by telephone and verified that I am speaking with the correct person using two identifiers.  Location: Patient: Home Provider: [facility]   I discussed the limitations, risks, security and privacy concerns of performing an evaluation and management service by telephone and the availability of in person appointments. I also discussed with the patient that there may be a patient responsible charge related to this service. The patient expressed understanding and agreed to proceed.   Diarrhea  This is a new problem. The current episode started in the past 7 days (Started on Monday). The problem occurs 2 to 4 times per day. The problem has been unchanged. The stool consistency is described as watery. The patient states that diarrhea awakens him from sleep. Associated symptoms include abdominal pain, bloating, chills, a fever (started last night with the chills) and vomiting. Associated symptoms comments: nausea. Nothing aggravates the symptoms. There are no known risk factors. He has tried nothing for the symptoms.   Patient reports symptoms started Monday. Yesterday was his worst day, and today he is feeling slightly better. No vomiting since yesterday. No fevers today. Nausea still present but improved slightly. Diarrhea still watery and occurring approximately 2-3 times per day. No melena or hematochezia. No one else sick. No known covid exposure.   No Known Allergies   Current Outpatient Medications:  .  acetaminophen (TYLENOL) 500 MG tablet, Take 1,000 mg by mouth every 6 (six) hours as needed (for pain.)., Disp: , Rfl:  .  aspirin EC 81 MG tablet, Take 81 mg by mouth daily.,  Disp: , Rfl:  .  atorvastatin (LIPITOR) 80 MG tablet, Take 1 tablet (80 mg total) by mouth daily., Disp: 90 tablet, Rfl: 3 .  carvedilol (COREG) 3.125 MG tablet, Take 3.125 mg by mouth 2 (two) times daily with a meal. , Disp: , Rfl:  .  clopidogrel (PLAVIX) 75 MG tablet, Take 75 mg by mouth daily. , Disp: , Rfl:  .  ibuprofen (ADVIL,MOTRIN) 200 MG tablet, Take 200 mg by mouth every 8 (eight) hours as needed (for pain.)., Disp: , Rfl:  .  ramipril (ALTACE) 2.5 MG capsule, Take 2.5 mg by mouth at bedtime. , Disp: , Rfl:  .  sildenafil (REVATIO) 20 MG tablet, 3-5 tablets as needed prior to intercourse, not to exceed 5 tablets in a day, Disp: 30 tablet, Rfl: 5  Review of Systems  Constitutional: Positive for chills, fatigue and fever (started last night with the chills).  HENT: Negative.   Respiratory: Negative.   Cardiovascular: Negative.   Gastrointestinal: Positive for abdominal distention, abdominal pain, bloating, diarrhea, nausea and vomiting. Negative for blood in stool, constipation and rectal pain.  Genitourinary: Negative.   Neurological: Negative.     Social History   Tobacco Use  . Smoking status: Former Smoker    Types: Cigarettes    Quit date: 07/19/1996    Years since quitting: 22.6  . Smokeless tobacco: Never Used  Substance Use Topics  . Alcohol use: Yes    Alcohol/week: 5.0 - 6.0 standard drinks    Types: 5 - 6 Glasses of wine  per week      Objective:   BP 123/74 (BP Location: Left Wrist, Patient Position: Sitting, Cuff Size: Normal)   Pulse 85   Temp (!) 97.4 F (36.3 C) (Oral) Comment: last night 99.5  Wt 140 lb (63.5 kg)   BMI 25.61 kg/m  Vitals:   03/07/19 1108  BP: 123/74  Pulse: 85  Temp: (!) 97.4 F (36.3 C)  TempSrc: Oral  Weight: 140 lb (63.5 kg)     Physical Exam Constitutional:      General: He is not in acute distress. Pulmonary:     Effort: No respiratory distress.  Neurological:     Mental Status: He is alert.     No results  found for any visits on 03/07/19.     Assessment & Plan     1. Nausea and vomiting, intractability of vomiting not specified, unspecified vomiting type Suspect possible viral gastroenteritis. Will treat with zofran and imodium as below. Discussed bland diet and pushing fluids. Call if symptoms worsen or fail to improve over the next 7-10 days.  - ondansetron (ZOFRAN) 4 MG tablet; Take 1 tablet (4 mg total) by mouth every 8 (eight) hours as needed.  Dispense: 20 tablet; Refill: 0  2. Diarrhea, unspecified type See above medical treatment plan. - ondansetron (ZOFRAN) 4 MG tablet; Take 1 tablet (4 mg total) by mouth every 8 (eight) hours as needed.  Dispense: 20 tablet; Refill: 0 - loperamide (IMODIUM A-D) 2 MG tablet; Take 1 tablet (2 mg total) by mouth 4 (four) times daily as needed for diarrhea or loose stools.  Dispense: 30 tablet; Refill: 0  3. Gastroenteritis See above medical treatment plan. - ondansetron (ZOFRAN) 4 MG tablet; Take 1 tablet (4 mg total) by mouth every 8 (eight) hours as needed.  Dispense: 20 tablet; Refill: 0   I discussed the assessment and treatment plan with the patient. The patient was provided an opportunity to ask questions and all were answered. The patient agreed with the plan and demonstrated an understanding of the instructions.   The patient was advised to call back or seek an in-person evaluation if the symptoms worsen or if the condition fails to improve as anticipated.  I provided 15 minutes of non-face-to-face time during this encounter.    Mar Daring, PA-C  [facility]

## 2019-03-07 NOTE — Patient Instructions (Signed)
Viral Gastroenteritis, Adult  Viral gastroenteritis is also known as the stomach flu. This condition may affect your stomach, your small intestine, and your large intestine. It can cause sudden watery poop (diarrhea), fever, and throwing up (vomiting). This condition is caused by certain germs (viruses). These germs can be passed from person to person very easily (are contagious). Having watery poop and throwing up can make you feel weak and cause you to not have enough water in your body (get dehydrated). This can make you tired and thirsty, make you have a dry mouth, and make it so you pee (urinate) less often. It is important to replace the fluids that you lose from having watery poop and throwing up. What are the causes?  You can get sick by catching viruses from other people.  You can also get sick by: ? Eating food, drinking water, or touching a surface that has the viruses on it (is contaminated). ? Sharing utensils or other personal items with a person who is sick. What increases the risk?  Having a weak body defense system (immune system).  Living with one or more children who are younger than 2 years old.  Living in a nursing home.  Going on cruise ships. What are the signs or symptoms? Symptoms of this condition start suddenly. Symptoms may last for a few days or for as long as a week.  Common symptoms include: ? Watery poop. ? Throwing up.  Other symptoms include: ? Fever. ? Headache. ? Feeling tired (fatigue). ? Pain in the belly (abdomen). ? Chills. ? Feeling weak. ? Feeling sick to your stomach (nauseous). ? Muscle aches. ? Not feeling hungry. How is this treated?  This condition typically goes away on its own.  The focus of treatment is to replace the fluids that you lose. This condition may be treated with: ? An ORS (oral rehydration solution). This is a drink that is sold at pharmacies and stores. ? Medicines to help with your symptoms. ? Probiotic  supplements to reduce symptoms of diarrhea. ? Fluids given through an IV tube, if needed.  Older adults and people with other diseases or a weak body defense system are at higher risk for not having enough water in the body. Follow these instructions at home: Eating and drinking   Take an ORS as told by your doctor.  Drink clear fluids in small amounts as you are able. Clear fluids include: ? Water. ? Ice chips. ? Fruit juice with water added to it (diluted). ? Low-calorie sports drinks.  Drink enough fluid to keep your pee (urine) pale yellow.  Eat small amounts of healthy foods every 3-4 hours as you are able. This may include whole grains, fruits, vegetables, lean meats, and yogurt.  Avoid fluids that have a lot of sugar or caffeine in them, such as energy drinks, sports drinks, and soda.  Avoid spicy or fatty foods.  Avoid alcohol. General instructions   Wash your hands often. This is very important after you have watery poop or you throw up. If you cannot use soap and water, use hand sanitizer.  Make sure that all people in your home wash their hands well and often.  Take over-the-counter and prescription medicines only as told by your doctor.  Rest at home while you get better.  Watch your condition for any changes.  Take a warm bath to help with any burning or pain from having watery poop.  Keep all follow-up visits as told by your doctor.   This is important. Contact a doctor if:  You cannot keep fluids down.  Your symptoms get worse.  You have new symptoms.  You feel light-headed.  You feel dizzy.  You have muscle cramps. Get help right away if:  You have chest pain.  You feel very weak.  You pass out (faint).  You see blood in your throw-up.  Your throw-up looks like coffee grounds.  You have bloody or black poop (stools) or poop that looks like tar.  You have a very bad headache, or a stiff neck, or both.  You have a rash.  You have  very bad pain, cramping, or bloating in your belly.  You have trouble breathing.  You are breathing very quickly.  You have a fast heartbeat.  Your skin feels cold and clammy.  You feel mixed up (confused).  You have pain when you pee.  You have signs of not having enough water in the body, such as: ? Dark pee, hardly any pee, or no pee. ? Cracked lips. ? Dry mouth. ? Sunken eyes. ? Feeling very sleepy. ? Feeling weak. Summary  Viral gastroenteritis is also known as the stomach flu.  This condition can cause sudden watery poop (diarrhea), fever, and throwing up (vomiting).  These germs can be passed from person to person very easily.  Take an ORS as told by your doctor. This is a drink that is sold at pharmacies and stores.  Drink fluids in small amounts many times each day as you are able. This information is not intended to replace advice given to you by your health care provider. Make sure you discuss any questions you have with your health care provider. Document Released: 12/22/2007 Document Revised: 05/10/2018 Document Reviewed: 05/10/2018 Elsevier Patient Education  2020 [place] to Help Relieve Diarrhea, Adult When you have diarrhea, the foods you eat and your eating habits are very important. Choosing the right foods and drinks can help:  Relieve diarrhea.  Replace lost fluids and nutrients.  Prevent dehydration. What general guidelines should I follow?  Relieving diarrhea  Choose foods with less than 2 g or .07 oz. of fiber per serving.  Limit fats to less than 8 tsp (38 g or 1.34 oz.) a day.  Avoid the following: ? Foods and beverages sweetened with high-fructose corn syrup, honey, or sugar alcohols such as xylitol, sorbitol, and mannitol. ? Foods that contain a lot of fat or sugar. ? Fried, greasy, or spicy foods. ? High-fiber grains, breads, and cereals. ? Raw fruits and vegetables.  Eat foods that are rich in probiotics. These  foods include dairy products such as yogurt and fermented milk products. They help increase healthy bacteria in the stomach and intestines (gastrointestinal tract, or GI tract).  If you have lactose intolerance, avoid dairy products. These may make your diarrhea worse.  Take medicine to help stop diarrhea (antidiarrheal medicine) only as told by your health care provider. Replacing nutrients  Eat small meals or snacks every 3-4 hours.  Eat bland foods, such as white rice, toast, or baked potato, until your diarrhea starts to get better. Gradually reintroduce nutrient-rich foods as tolerated or as told by your health care provider. This includes: ? Well-cooked protein foods. ? Peeled, seeded, and soft-cooked fruits and vegetables. ? Low-fat dairy products.  Take vitamin and mineral supplements as told by your health care provider. Preventing dehydration  Start by sipping water or a special solution to prevent dehydration (oral rehydration solution, ORS). Urine  that is clear or pale yellow means that you are getting enough fluid.  Try to drink at least 8-10 cups of fluid each day to help replace lost fluids.  You may add other liquids in addition to water, such as clear juice or decaffeinated sports drinks, as tolerated or as told by your health care provider.  Avoid drinks with caffeine, such as coffee, tea, or soft drinks.  Avoid alcohol. What foods are recommended?     The items listed may not be a complete list. Talk with your health care provider about what dietary choices are best for you. Grains White rice. White, [place], or pita breads (fresh or toasted), including plain rolls, buns, or bagels. White pasta. Saltine, soda, or graham crackers. Pretzels. Low-fiber cereal. Cooked cereals made with water (such as cornmeal, farina, or cream cereals). Plain muffins. Matzo. Melba toast. Zwieback. Vegetables Potatoes (without the skin). Most well-cooked and canned vegetables without  skins or seeds. Tender lettuce. Fruits Apple sauce. Fruits canned in juice. Cooked apricots, cherries, grapefruit, peaches, pears, or plums. Fresh bananas and cantaloupe. Meats and other protein foods Baked or boiled chicken. Eggs. Tofu. Fish. Seafood. Smooth nut butters. Ground or well-cooked tender beef, ham, veal, lamb, pork, or poultry. Dairy Plain yogurt, kefir, and unsweetened liquid yogurt. Lactose-free milk, buttermilk, skim milk, or soy milk. Low-fat or nonfat hard cheese. Beverages Water. Low-calorie sports drinks. Fruit juices without pulp. Strained tomato and vegetable juices. Decaffeinated teas. Sugar-free beverages not sweetened with sugar alcohols. Oral rehydration solutions, if approved by your health care provider. Seasoning and other foods Bouillon, broth, or soups made from recommended foods. What foods are not recommended? The items listed may not be a complete list. Talk with your health care provider about what dietary choices are best for you. Grains Whole grain, whole wheat, bran, or rye breads, rolls, pastas, and crackers. Wild or brown rice. Whole grain or bran cereals. Barley. Oats and oatmeal. Corn tortillas or taco shells. Granola. Popcorn. Vegetables Raw vegetables. Fried vegetables. Cabbage, broccoli, Brussels sprouts, artichokes, baked beans, beet greens, corn, kale, legumes, peas, sweet potatoes, and yams. Potato skins. Cooked spinach and cabbage. Fruits Dried fruit, including raisins and dates. Raw fruits. Stewed or dried prunes. Canned fruits with syrup. Meat and other protein foods Fried or fatty meats. Deli meats. Chunky nut butters. Nuts and seeds. Beans and lentils. Berniece Salines. Hot dogs. Sausage. Dairy High-fat cheeses. Whole milk, chocolate milk, and beverages made with milk, such as milk shakes. Half-and-half. Cream. sour cream. Ice cream. Beverages Caffeinated beverages (such as coffee, tea, soda, or energy drinks). Alcoholic beverages. Fruit juices with  pulp. Prune juice. Soft drinks sweetened with high-fructose corn syrup or sugar alcohols. High-calorie sports drinks. Fats and oils Butter. Cream sauces. Margarine. Salad oils. Plain salad dressings. Olives. Avocados. Mayonnaise. Sweets and desserts Sweet rolls, doughnuts, and sweet breads. Sugar-free desserts sweetened with sugar alcohols such as xylitol and sorbitol. Seasoning and other foods Honey. Hot sauce. Chili powder. Gravy. Cream-based or milk-based soups. Pancakes and waffles. Summary  When you have diarrhea, the foods you eat and your eating habits are very important.  Make sure you get at least 8-10 cups of fluid each day, or enough to keep your urine clear or pale yellow.  Eat bland foods and gradually reintroduce healthy, nutrient-rich foods as tolerated, or as told by your health care provider.  Avoid high-fiber, fried, greasy, or spicy foods. This information is not intended to replace advice given to you by your health care provider. Make  sure you discuss any questions you have with your health care provider. Document Released: 09/25/2003 Document Revised: 10/26/2018 Document Reviewed: 07/02/2016 Elsevier Patient Education  2020 [employer].

## 2019-04-20 DIAGNOSIS — H40003 Preglaucoma, unspecified, bilateral: Secondary | ICD-10-CM | POA: Diagnosis not present

## 2019-04-25 ENCOUNTER — Ambulatory Visit (INDEPENDENT_AMBULATORY_CARE_PROVIDER_SITE_OTHER): Payer: Medicare Other

## 2019-04-25 ENCOUNTER — Other Ambulatory Visit: Payer: Self-pay

## 2019-04-25 DIAGNOSIS — Z23 Encounter for immunization: Secondary | ICD-10-CM

## 2019-07-16 DIAGNOSIS — D0472 Carcinoma in situ of skin of left lower limb, including hip: Secondary | ICD-10-CM | POA: Diagnosis not present

## 2019-08-09 DIAGNOSIS — D0472 Carcinoma in situ of skin of left lower limb, including hip: Secondary | ICD-10-CM | POA: Diagnosis not present

## 2019-08-31 DIAGNOSIS — Z23 Encounter for immunization: Secondary | ICD-10-CM | POA: Diagnosis not present

## 2019-09-28 DIAGNOSIS — Z23 Encounter for immunization: Secondary | ICD-10-CM | POA: Diagnosis not present

## 2019-11-26 DIAGNOSIS — I2510 Atherosclerotic heart disease of native coronary artery without angina pectoris: Secondary | ICD-10-CM | POA: Diagnosis not present

## 2019-11-26 DIAGNOSIS — E782 Mixed hyperlipidemia: Secondary | ICD-10-CM | POA: Diagnosis not present

## 2019-11-26 DIAGNOSIS — I10 Essential (primary) hypertension: Secondary | ICD-10-CM | POA: Diagnosis not present

## 2019-11-26 DIAGNOSIS — I255 Ischemic cardiomyopathy: Secondary | ICD-10-CM | POA: Diagnosis not present

## 2020-02-11 NOTE — Progress Notes (Signed)
Subjective:   AYDEEN BLUME is a 69 y.o. male who presents for Medicare Annual/Subsequent preventive examination.  I connected with Conan Mcmanaway today by telephone and verified that I am speaking with the correct person using two identifiers. Location patient: home Location provider: work Persons participating in the virtual visit: patient, provider.   I discussed the limitations, risks, security and privacy concerns of performing an evaluation and management service by telephone and the availability of in person appointments. I also discussed with the patient that there may be a patient responsible charge related to this service. The patient expressed understanding and verbally consented to this telephonic visit.    Interactive audio and video telecommunications were attempted between this provider and patient, however failed, due to patient having technical difficulties OR patient did not have access to video capability.  We continued and completed visit with audio only.   Review of Systems    N/A  Cardiac Risk Factors include: advanced age (>65men, >102 women);dyslipidemia;hypertension;male gender     Objective:    There were no vitals filed for this visit. There is no height or weight on file to calculate BMI.  Advanced Directives 02/12/2020 10/06/2018 10/04/2017 06/08/2016 05/26/2016  Does Patient Have a Medical Advance Directive? Yes Yes Yes Yes Yes  Type of Paramedic of [place];Living will [place];Living will [place];Living will [place];Living will [place];Living will  Does patient want to make changes to medical advance directive? - - - No - Patient declined -  Copy of [place] in Chart? No - copy requested No - copy requested No - copy requested - -    Current Medications (verified) Outpatient Encounter Medications as of 02/12/2020  Medication Sig    . acetaminophen (TYLENOL) 500 MG tablet Take 1,000 mg by mouth every 6 (six) hours as needed (for pain.).  Marland Kitchen aspirin EC 81 MG tablet Take 81 mg by mouth daily.  Marland Kitchen atorvastatin (LIPITOR) 80 MG tablet Take 1 tablet (80 mg total) by mouth daily.  . carvedilol (COREG) 3.125 MG tablet Take 3.125 mg by mouth 2 (two) times daily with a meal.   . clopidogrel (PLAVIX) 75 MG tablet Take 75 mg by mouth daily.   Marland Kitchen ibuprofen (ADVIL,MOTRIN) 200 MG tablet Take 200 mg by mouth every 8 (eight) hours as needed (for pain.).  Marland Kitchen ramipril (ALTACE) 2.5 MG capsule Take 2.5 mg by mouth at bedtime.   . sildenafil (REVATIO) 20 MG tablet 3-5 tablets as needed prior to intercourse, not to exceed 5 tablets in a day  . [DISCONTINUED] ondansetron (ZOFRAN) 4 MG tablet Take 1 tablet (4 mg total) by mouth every 8 (eight) hours as needed.  . loperamide (IMODIUM A-D) 2 MG tablet Take 1 tablet (2 mg total) by mouth 4 (four) times daily as needed for diarrhea or loose stools.   No facility-administered encounter medications on file as of 02/12/2020.    Allergies (verified) Patient has no known allergies.   History: Past Medical History:  Diagnosis Date  . Arthritis    right hip  . Cancer [facility]) 2011   Melanoma  . Coronary artery disease   . Elevated lipids   . Heart disease   . Hyperlipidemia   . Hypertension   . Myocardial infarction [facility])    Past Surgical History:  Procedure Laterality Date  . BASAL CELL CARCINOMA EXCISION     right eyelid  . CARDIAC CATHETERIZATION    .  COLONOSCOPY  2004  . CORONARY ANGIOPLASTY    . CORONARY ARTERY BYPASS GRAFT  1998  . CORONARY ARTERY BYPASS GRAFT     3 vessels  . HERNIA REPAIR Left 2008   inguinal  . HERNIA REPAIR Right 07-05-13   inguinal   . ROTATOR CUFF REPAIR  2005  . TONSILLECTOMY    . TOTAL HIP ARTHROPLASTY Left 06/08/2016   Procedure: TOTAL HIP ARTHROPLASTY ANTERIOR APPROACH;  Surgeon: Hessie Knows, MD;  Location: [facility] ORS;  Service: Orthopedics;  Laterality:  Left;   Family History  Problem Relation Age of Onset  . Lymphoma Father   . Heart disease Mother   . Dementia Mother   . Arthritis Sister   . Heart disease Sister   . COPD Sister    Social History   Socioeconomic History  . Marital status: Married    Spouse name: Not on file  . Number of children: 1  . Years of education: Not on file  . Highest education level: Bachelor's degree (e.g., BA, AB, BS)  Occupational History  . Not on file  Tobacco Use  . Smoking status: Former Smoker    Types: Cigarettes    Quit date: 07/19/1996    Years since quitting: 23.5  . Smokeless tobacco: Never Used  Vaping Use  . Vaping Use: Never used  Substance and Sexual Activity  . Alcohol use: Yes    Alcohol/week: 5.0 - 6.0 standard drinks    Types: 5 - 6 Glasses of wine per week  . Drug use: No  . Sexual activity: Not on file  Other Topics Concern  . Not on file  Social History Narrative  . Not on file   Social Determinants of Health   Financial Resource Strain: Low Risk   . Difficulty of Paying Living Expenses: Not hard at all  Food Insecurity: No Food Insecurity  . Worried About Charity fundraiser in the Last Year: Never true  . Ran Out of Food in the Last Year: Never true  Transportation Needs: No Transportation Needs  . Lack of Transportation (Medical): No  . Lack of Transportation (Non-Medical): No  Physical Activity: Inactive  . Days of Exercise per Week: 0 days  . Minutes of Exercise per Session: 0 min  Stress: No Stress Concern Present  . Feeling of Stress : Not at all  Social Connections: Moderately Integrated  . Frequency of Communication with Friends and Family: Three times a week  . Frequency of Social Gatherings with Friends and Family: More than three times a week  . Attends Religious Services: More than 4 times per year  . Active Member of Clubs or Organizations: No  . Attends Archivist Meetings: Never  . Marital Status: Married    Tobacco  Counseling Counseling given: Not Answered   Clinical Intake:  Pre-visit preparation completed: Yes  Pain : No/denies pain     Nutritional Risks: None Diabetes: No  How often do you need to have someone help you when you read instructions, pamphlets, or other written materials from your doctor or pharmacy?: 1 - Never  Diabetic? No  Interpreter Needed?: No  Information entered by :: [facility], LPN   Activities of Daily Living In your present state of health, do you have any difficulty performing the following activities: 02/12/2020  Hearing? N  Vision? N  Difficulty concentrating or making decisions? N  Walking or climbing stairs? Y  Comment Due to right hip pains.  Dressing or bathing? N  Doing errands, shopping? N  Preparing Food and eating ? N  Using the Toilet? N  In the past six months, have you accidently leaked urine? N  Do you have problems with loss of bowel control? N  Managing your Medications? N  Managing your Finances? N  Housekeeping or managing your Housekeeping? N  Some recent data might be hidden    Patient Care Team: Birdie Sons, MD as PCP - General (Family Medicine) Corey Skains, MD as Consulting Physician (Cardiology) Oneta Rack, MD as Consulting Physician (Dermatology) Hessie Knows, MD as Consulting Physician (Orthopedic Surgery)  Indicate any recent Medical Services you may have received from other than [facility] providers in the past year (date may be approximate).     Assessment:   This is a routine wellness examination for Yavier.  Hearing/Vision screen No exam data present  Dietary issues and exercise activities discussed: Current Exercise Habits: The patient does not participate in regular exercise at present, Exercise limited by: orthopedic condition(s);Other - see comments (arthritis pain in hip)  Goals    . DIET - INCREASE WATER INTAKE     Recommend increasing water intake to 3 glasses or more a day.        Depression Screen PHQ 2/9 Scores 02/12/2020 10/06/2018 10/04/2017  PHQ - 2 Score 0 0 0    Fall Risk Fall Risk  02/12/2020 10/06/2018 10/04/2017  Falls in the past year? 0 0 No  Number falls in past yr: 0 - -  Injury with Fall? 0 - -    Any stairs in or around the home? Yes  If so, are there any without handrails? No  Home free of loose throw rugs in walkways, pet beds, electrical cords, etc? Yes  Adequate lighting in your home to reduce risk of falls? Yes   ASSISTIVE DEVICES UTILIZED TO PREVENT FALLS:  Life alert? No  Use of a cane, walker or w/c? No  Grab bars in the bathroom? No  Shower chair or bench in shower? No  Elevated toilet seat or a handicapped toilet? No    Cognitive Function:     6CIT Screen 02/12/2020 10/06/2018  What Year? 0 points 0 points  What month? 0 points 0 points  What time? 0 points 0 points  Count back from 20 0 points 0 points  Months in reverse 0 points 0 points  Repeat phrase 0 points 0 points  Total Score 0 0    Immunizations Immunization History  Administered Date(s) Administered  . Fluad Quad(high Dose 65+) 04/25/2019  . Influenza Split 05/12/2011, 05/18/2012  . Influenza, High Dose Seasonal PF 06/05/2015, 05/20/2016, 05/04/2017, 05/03/2018  . Influenza,inj,Quad PF,6+ Mos 05/16/2013, 05/16/2014  . Moderna SARS-COVID-2 Vaccination 08/31/2019, 09/28/2019  . Pneumococcal Conjugate-13 10/04/2017  . Pneumococcal Polysaccharide-23 10/06/2018  . Tdap 02/20/2014    TDAP status: Up to date Flu Vaccine status: Up to date Pneumococcal vaccine status: Up to date Covid-19 vaccine status: Completed vaccines  Qualifies for Shingles Vaccine? Yes   Zostavax completed No   Shingrix Completed?: No.    Education has been provided regarding the importance of this vaccine. Patient has been advised to call insurance company to determine out of pocket expense if they have not yet received this vaccine. Advised may also receive vaccine at local pharmacy or  Health Dept. Verbalized acceptance and understanding.  Screening Tests Health Maintenance  Topic Date Due  . INFLUENZA VACCINE  02/17/2020  . TETANUS/TDAP  02/21/2024  . COLONOSCOPY  05/28/2024  .  COVID-19 Vaccine  Completed  . Hepatitis C Screening  Completed  . PNA vac Low Risk Adult  Completed    Health Maintenance  There are no preventive care reminders to display for this patient.  Colorectal cancer screening: Completed 05/28/14. Repeat every 10 years  Lung Cancer Screening: (Low Dose CT Chest recommended if Age 102-80 years, 30 pack-year currently smoking OR have quit w/in 15years.) does not qualify.   Additional Screening:  Hepatitis C Screening: Up to date  Vision Screening: Recommended annual ophthalmology exams for early detection of glaucoma and other disorders of the eye. Is the patient up to date with their annual eye exam? No, pt plans to find an opthalmologist and schedule an apt this year. Who is the provider or what is the name of the office in which the patient attends annual eye exams? N/A If pt is not established with a provider, would they like to be referred to a provider to establish care? No, declined referral.   Dental Screening: Recommended annual dental exams for proper oral hygiene  Community Resource Referral / Chronic Care Management: CRR required this visit?  No   CCM required this visit?  No      Plan:     I have personally reviewed and noted the following in the patient's chart:   . Medical and social history . Use of alcohol, tobacco or illicit drugs  . Current medications and supplements . Functional ability and status . Nutritional status . Physical activity . Advanced directives . List of other physicians . Hospitalizations, surgeries, and ER visits in previous 12 months . Vitals . Screenings to include cognitive, depression, and falls . Referrals and appointments  In addition, I have reviewed and discussed with patient  certain preventive protocols, quality metrics, and best practice recommendations. A written personalized care plan for preventive services as well as general preventive health recommendations were provided to patient.     Allona Gondek [place], [place]   04/07/1659   Nurse Notes: Recommend to schedule a yearly eye exam.

## 2020-02-12 ENCOUNTER — Ambulatory Visit (INDEPENDENT_AMBULATORY_CARE_PROVIDER_SITE_OTHER): Payer: Medicare Other

## 2020-02-12 ENCOUNTER — Other Ambulatory Visit: Payer: Self-pay

## 2020-02-12 DIAGNOSIS — Z0000 Encounter for general adult medical examination without abnormal findings: Secondary | ICD-10-CM

## 2020-02-12 NOTE — Patient Instructions (Addendum)
[name] , Thank you for taking time to come for your Medicare Wellness Visit. I appreciate your ongoing commitment to your health goals. Please review the following plan we discussed and let me know if I can assist you in the future.   Screening recommendations/referrals: Colonoscopy: Up to date, due 05/2024 Recommended yearly ophthalmology/optometry visit for glaucoma screening and checkup Recommended yearly dental visit for hygiene and checkup  Vaccinations: Influenza vaccine: Done 04/25/19 Pneumococcal vaccine: Completed series Tdap vaccine: Up to date, due 02/2024 Shingles vaccine: Shingrix discussed. Please contact your pharmacy for coverage information.     Advanced directives: Please bring a copy of your POA (Power of Attorney) and/or Living Will to your next appointment.   Conditions/risks identified: Recommend increasing water intake to 6-8 8 oz glasses a day. Also recommend scheduling a yearly eye exam.   Next appointment: 04/21/20 @ 8:00 AM with Dr Caryn Section. Declined scheduling an AWV for 2022 at this time.   Preventive Care 70 Years and Older, Male Preventive care refers to lifestyle choices and visits with your health care provider that can promote health and wellness. What does preventive care include?  A yearly physical exam. This is also called an annual well check.  Dental exams once or twice a year.  Routine eye exams. Ask your health care provider how often you should have your eyes checked.  Personal lifestyle choices, including:  Daily care of your teeth and gums.  Regular physical activity.  Eating a healthy diet.  Avoiding tobacco and drug use.  Limiting alcohol use.  Practicing safe sex.  Taking low doses of aspirin every day.  Taking vitamin and mineral supplements as recommended by your health care provider. What happens during an annual well check? The services and screenings done by your health care provider during your annual well check will  depend on your age, overall health, lifestyle risk factors, and family history of disease. Counseling  Your health care provider may ask you questions about your:  Alcohol use.  Tobacco use.  Drug use.  Emotional well-being.  Home and relationship well-being.  Sexual activity.  Eating habits.  History of falls.  Memory and ability to understand (cognition).  Work and work Statistician. Screening  You may have the following tests or measurements:  Height, weight, and BMI.  Blood pressure.  Lipid and cholesterol levels. These may be checked every 5 years, or more frequently if you are over 60 years old.  Skin check.  Lung cancer screening. You may have this screening every year starting at age 33 if you have a 30-pack-year history of smoking and currently smoke or have quit within the past 15 years.  Fecal occult blood test (FOBT) of the stool. You may have this test every year starting at age 7.  Flexible sigmoidoscopy or colonoscopy. You may have a sigmoidoscopy every 5 years or a colonoscopy every 10 years starting at age 63.  Prostate cancer screening. Recommendations will vary depending on your family history and other risks.  Hepatitis C blood test.  Hepatitis B blood test.  Sexually transmitted disease (STD) testing.  Diabetes screening. This is done by checking your blood sugar (glucose) after you have not eaten for a while (fasting). You may have this done every 1-3 years.  Abdominal aortic aneurysm (AAA) screening. You may need this if you are a current or former smoker.  Osteoporosis. You may be screened starting at age 72 if you are at high risk. Talk with your health care provider  about your test results, treatment options, and if necessary, the need for more tests. Vaccines  Your health care provider may recommend certain vaccines, such as:  Influenza vaccine. This is recommended every year.  Tetanus, diphtheria, and acellular pertussis (Tdap,  Td) vaccine. You may need a Td booster every 10 years.  Zoster vaccine. You may need this after age 7.  Pneumococcal 13-valent conjugate (PCV13) vaccine. One dose is recommended after age 65.  Pneumococcal polysaccharide (PPSV23) vaccine. One dose is recommended after age 69. Talk to your health care provider about which screenings and vaccines you need and how often you need them. This information is not intended to replace advice given to you by your health care provider. Make sure you discuss any questions you have with your health care provider. Document Released: 08/01/2015 Document Revised: 03/24/2016 Document Reviewed: 05/06/2015 Elsevier Interactive Patient Education  2017 [employer] Prevention in the Home Falls can cause injuries. They can happen to people of all ages. There are many things you can do to make your home safe and to help prevent falls. What can I do on the outside of my home?  Regularly fix the edges of walkways and driveways and fix any cracks.  Remove anything that might make you trip as you walk through a door, such as a raised step or threshold.  Trim any bushes or trees on the path to your home.  Use bright outdoor lighting.  Clear any walking paths of anything that might make someone trip, such as rocks or tools.  Regularly check to see if handrails are loose or broken. Make sure that both sides of any steps have handrails.  Any raised decks and porches should have guardrails on the edges.  Have any leaves, snow, or ice cleared regularly.  Use sand or salt on walking paths during winter.  Clean up any spills in your garage right away. This includes oil or grease spills. What can I do in the bathroom?  Use night lights.  Install grab bars by the toilet and in the tub and shower. Do not use towel bars as grab bars.  Use non-skid mats or decals in the tub or shower.  If you need to sit down in the shower, use a plastic, non-slip  stool.  Keep the floor dry. Clean up any water that spills on the floor as soon as it happens.  Remove soap buildup in the tub or shower regularly.  Attach bath mats securely with double-sided non-slip rug tape.  Do not have throw rugs and other things on the floor that can make you trip. What can I do in the bedroom?  Use night lights.  Make sure that you have a light by your bed that is easy to reach.  Do not use any sheets or blankets that are too big for your bed. They should not hang down onto the floor.  Have a firm chair that has side arms. You can use this for support while you get dressed.  Do not have throw rugs and other things on the floor that can make you trip. What can I do in the kitchen?  Clean up any spills right away.  Avoid walking on wet floors.  Keep items that you use a lot in easy-to-reach places.  If you need to reach something above you, use a strong step stool that has a grab bar.  Keep electrical cords out of the way.  Do not use floor polish or  wax that makes floors slippery. If you must use wax, use non-skid floor wax.  Do not have throw rugs and other things on the floor that can make you trip. What can I do with my stairs?  Do not leave any items on the stairs.  Make sure that there are handrails on both sides of the stairs and use them. Fix handrails that are broken or loose. Make sure that handrails are as long as the stairways.  Check any carpeting to make sure that it is firmly attached to the stairs. Fix any carpet that is loose or worn.  Avoid having throw rugs at the top or bottom of the stairs. If you do have throw rugs, attach them to the floor with carpet tape.  Make sure that you have a light switch at the top of the stairs and the bottom of the stairs. If you do not have them, ask someone to add them for you. What else can I do to help prevent falls?  Wear shoes that:  Do not have high heels.  Have rubber bottoms.  Are  comfortable and fit you well.  Are closed at the toe. Do not wear sandals.  If you use a stepladder:  Make sure that it is fully opened. Do not climb a closed stepladder.  Make sure that both sides of the stepladder are locked into place.  Ask someone to hold it for you, if possible.  Clearly mark and make sure that you can see:  Any grab bars or handrails.  First and last steps.  Where the edge of each step is.  Use tools that help you move around (mobility aids) if they are needed. These include:  Canes.  Walkers.  Scooters.  Crutches.  Turn on the lights when you go into a dark area. Replace any light bulbs as soon as they burn out.  Set up your furniture so you have a clear path. Avoid moving your furniture around.  If any of your floors are uneven, fix them.  If there are any pets around you, be aware of where they are.  Review your medicines with your doctor. Some medicines can make you feel dizzy. This can increase your chance of falling. Ask your doctor what other things that you can do to help prevent falls. This information is not intended to replace advice given to you by your health care provider. Make sure you discuss any questions you have with your health care provider. Document Released: 05/01/2009 Document Revised: 12/11/2015 Document Reviewed: 08/09/2014 Elsevier Interactive Patient Education  2017 [employer].

## 2020-04-21 ENCOUNTER — Other Ambulatory Visit: Payer: Self-pay

## 2020-04-21 ENCOUNTER — Ambulatory Visit (INDEPENDENT_AMBULATORY_CARE_PROVIDER_SITE_OTHER): Payer: Medicare Other | Admitting: Family Medicine

## 2020-04-21 VITALS — BP 132/68 | HR 70 | Temp 98.2°F | Wt 134.0 lb

## 2020-04-21 DIAGNOSIS — I10 Essential (primary) hypertension: Secondary | ICD-10-CM

## 2020-04-21 DIAGNOSIS — Z125 Encounter for screening for malignant neoplasm of prostate: Secondary | ICD-10-CM

## 2020-04-21 DIAGNOSIS — I25810 Atherosclerosis of coronary artery bypass graft(s) without angina pectoris: Secondary | ICD-10-CM | POA: Diagnosis not present

## 2020-04-21 NOTE — Patient Instructions (Signed)
.   Please review the attached list of medications and notify my office if there are any errors.   . Please bring all of your medications to every appointment so we can make sure that our medication list is the same as yours.   

## 2020-04-21 NOTE — Progress Notes (Signed)
Established patient visit   Patient: Bruce Moody   DOB: 06/16/1951   69 y.o. Male  MRN: [id number] Visit Date: 04/21/2020  Today's healthcare provider: Lelon Huh, MD   Chief Complaint  Patient presents with  . Hypertension   Subjective    HPI   Hypertension, follow-up  BP Readings from Last 3 Encounters:  04/21/20 132/68  03/07/19 123/74  10/06/18 114/66   Wt Readings from Last 3 Encounters:  04/21/20 134 lb (60.8 kg)  03/07/19 140 lb (63.5 kg)  10/06/18 141 lb 3.2 oz (64 kg)      Management since last visit includes continue the same medications.   He reports excellent compliance with treatment. He is not having side effects.  He is following a Regular diet. He is exercising. He does not smoke.  Use of agents associated with hypertension: none.   Outside blood pressures are normal at home. Symptoms: No chest pain No chest pressure  No palpitations No syncope  No dyspnea No orthopnea  No paroxysmal nocturnal dyspnea No lower extremity edema   Pertinent labs: Lab Results  Component Value Date   CHOL 137 03/17/2018   HDL 49 03/17/2018   LDLCALC 72 03/17/2018   TRIG 79 03/17/2018   CHOLHDL 2.8 03/17/2018   Lab Results  Component Value Date   NA 140 03/17/2018   K 4.4 03/17/2018   CREATININE 0.91 03/17/2018   GFRNONAA 86 03/17/2018   GFRAA 99 03/17/2018   GLUCOSE 86 03/17/2018     The 10-year ASCVD risk score Mikey Bussing DC Jr., et al., 2013) is: 19.9%   --------------------------------------------------------------------------------------------------- He continues regular follow up Dr. Nehemiah Massed for CAD and had low risk stress test done in June. Denies CP, dyspnea, or palpations.     Medications: Outpatient Medications Prior to Visit  Medication Sig  . acetaminophen (TYLENOL) 500 MG tablet Take 1,000 mg by mouth every 6 (six) hours as needed (for pain.).  Marland Kitchen aspirin EC 81 MG tablet Take 81 mg by mouth daily.  Marland Kitchen atorvastatin (LIPITOR) 80 MG  tablet Take 1 tablet (80 mg total) by mouth daily.  . carvedilol (COREG) 3.125 MG tablet Take 3.125 mg by mouth 2 (two) times daily with a meal.   . clopidogrel (PLAVIX) 75 MG tablet Take 75 mg by mouth daily.   Marland Kitchen ibuprofen (ADVIL,MOTRIN) 200 MG tablet Take 200 mg by mouth every 8 (eight) hours as needed (for pain.).  Marland Kitchen ramipril (ALTACE) 2.5 MG capsule Take 2.5 mg by mouth at bedtime.   Marland Kitchen loperamide (IMODIUM A-D) 2 MG tablet Take 1 tablet (2 mg total) by mouth 4 (four) times daily as needed for diarrhea or loose stools.  . sildenafil (REVATIO) 20 MG tablet 3-5 tablets as needed prior to intercourse, not to exceed 5 tablets in a day   No facility-administered medications prior to visit.    Review of Systems  Constitutional: Negative.   Respiratory: Negative.   Cardiovascular: Negative.   Gastrointestinal: Negative.   Neurological: Negative for dizziness, light-headedness and headaches.      Objective    BP 132/68 (BP Location: Right Arm, Patient Position: Sitting, Cuff Size: Normal)   Pulse 70   Temp 98.2 F (36.8 C) (Oral)   Wt 134 lb (60.8 kg)   SpO2 98%   BMI 24.51 kg/m    Physical Exam   General: Appearance:    Well developed, well nourished male in no acute distress  Eyes:    PERRL, conjunctiva/corneas clear, EOM's intact  Lungs:     Clear to auscultation bilaterally, respirations unlabored  Heart:    Normal heart rate. Normal rhythm. No murmurs, rubs, or gallops.   MS:   All extremities are intact.   Neurologic:   Awake, alert, oriented x 3. No apparent focal neurological           defect.         Assessment & Plan     1. Benign essential HTN Well controlled.  Continue current medications.   - CBC - Comprehensive metabolic panel - Lipid panel   2. Atherosclerosis of coronary artery bypass graft of native heart without angina pectoris Asymptomatic. Compliant with medication.  Continue aggressive risk factor modification.  Normal nuclear stress test done  in June. Continue annual follow up Dr. Nehemiah Massed.   3. Prostate cancer screening  - PSA   He is scheduled to return for flu vaccine.       The entirety of the information documented in the History of Present Illness, Review of Systems and Physical Exam were personally obtained by me. Portions of this information were initially documented by the CMA and reviewed by me for thoroughness and accuracy.      Lelon Huh, MD  [facility] [phone] (phone) [phone] (fax)  [place]

## 2020-04-22 LAB — CBC
Hematocrit: 41.1 % (ref 37.5–51.0)
Hemoglobin: 13.6 g/dL (ref 13.0–17.7)
MCH: 33.2 pg — ABNORMAL HIGH (ref 26.6–33.0)
MCHC: 33.1 g/dL (ref 31.5–35.7)
MCV: 100 fL — ABNORMAL HIGH (ref 79–97)
Platelets: 279 10*3/uL (ref 150–450)
RBC: 4.1 x10E6/uL — ABNORMAL LOW (ref 4.14–5.80)
RDW: 12.4 % (ref 11.6–15.4)
WBC: 5.8 10*3/uL (ref 3.4–10.8)

## 2020-04-22 LAB — LIPID PANEL
Chol/HDL Ratio: 2.8 ratio (ref 0.0–5.0)
Cholesterol, Total: 147 mg/dL (ref 100–199)
HDL: 52 mg/dL (ref >39)
LDL Chol Calc (NIH): 82 mg/dL (ref 0–99)
Triglycerides: 66 mg/dL (ref 0–149)
VLDL Cholesterol Cal: 13 mg/dL (ref 5–40)

## 2020-04-22 LAB — PSA: Prostate Specific Ag, Serum: 2.2 ng/mL (ref 0.0–4.0)

## 2020-04-22 LAB — COMPREHENSIVE METABOLIC PANEL
ALT: 26 IU/L (ref 0–44)
AST: 32 IU/L (ref 0–40)
Albumin/Globulin Ratio: 2.1 (ref 1.2–2.2)
Albumin: 4.6 g/dL (ref 3.7–4.7)
Alkaline Phosphatase: 102 IU/L (ref 44–121)
BUN/Creatinine Ratio: 13 (ref 10–24)
BUN: 11 mg/dL (ref 8–27)
Bilirubin Total: 0.6 mg/dL (ref 0.0–1.2)
CO2: 22 mmol/L (ref 20–29)
Calcium: 8.8 mg/dL (ref 8.6–10.2)
Chloride: 103 mmol/L (ref 96–106)
Creatinine, Ser: 0.84 mg/dL (ref 0.76–1.27)
GFR calc Af Amer: 102 mL/min/{1.73_m2} (ref >59)
GFR calc non Af Amer: 88 mL/min/{1.73_m2} (ref >59)
Globulin, Total: 2.2 g/dL (ref 1.5–4.5)
Glucose: 93 mg/dL (ref 65–99)
Potassium: 4.7 mmol/L (ref 3.5–5.2)
Sodium: 139 mmol/L (ref 134–144)
Total Protein: 6.8 g/dL (ref 6.0–8.5)

## 2020-04-23 ENCOUNTER — Other Ambulatory Visit: Payer: Self-pay

## 2020-04-23 ENCOUNTER — Ambulatory Visit (INDEPENDENT_AMBULATORY_CARE_PROVIDER_SITE_OTHER): Payer: Medicare Other

## 2020-04-23 DIAGNOSIS — Z23 Encounter for immunization: Secondary | ICD-10-CM | POA: Diagnosis not present

## 2020-06-02 DIAGNOSIS — Z23 Encounter for immunization: Secondary | ICD-10-CM | POA: Diagnosis not present

## 2020-07-09 DIAGNOSIS — I10 Essential (primary) hypertension: Secondary | ICD-10-CM | POA: Diagnosis not present

## 2020-07-09 DIAGNOSIS — I25810 Atherosclerosis of coronary artery bypass graft(s) without angina pectoris: Secondary | ICD-10-CM | POA: Diagnosis not present

## 2020-07-09 DIAGNOSIS — I255 Ischemic cardiomyopathy: Secondary | ICD-10-CM | POA: Diagnosis not present

## 2020-07-09 DIAGNOSIS — E782 Mixed hyperlipidemia: Secondary | ICD-10-CM | POA: Diagnosis not present

## 2020-08-25 DIAGNOSIS — H2513 Age-related nuclear cataract, bilateral: Secondary | ICD-10-CM | POA: Diagnosis not present

## 2020-09-23 DIAGNOSIS — I10 Essential (primary) hypertension: Secondary | ICD-10-CM | POA: Diagnosis not present

## 2020-09-23 DIAGNOSIS — H2511 Age-related nuclear cataract, right eye: Secondary | ICD-10-CM | POA: Diagnosis not present

## 2020-09-30 ENCOUNTER — Other Ambulatory Visit: Payer: Self-pay

## 2020-09-30 ENCOUNTER — Encounter: Payer: Self-pay | Admitting: Ophthalmology

## 2020-10-02 NOTE — Discharge Instructions (Signed)

## 2020-10-03 ENCOUNTER — Other Ambulatory Visit: Payer: Self-pay

## 2020-10-03 ENCOUNTER — Other Ambulatory Visit
Admission: RE | Admit: 2020-10-03 | Discharge: 2020-10-03 | Disposition: A | Discharge status: Home / Self Care | Payer: Medicare Other | Source: Ambulatory Visit | Attending: Ophthalmology | Admitting: Ophthalmology

## 2020-10-03 DIAGNOSIS — Z20822 Contact with and (suspected) exposure to covid-19: Secondary | ICD-10-CM | POA: Diagnosis not present

## 2020-10-03 DIAGNOSIS — Z01812 Encounter for preprocedural laboratory examination: Secondary | ICD-10-CM | POA: Diagnosis not present

## 2020-10-04 LAB — SARS CORONAVIRUS 2 (TAT 6-24 HRS): SARS Coronavirus 2: NEGATIVE

## 2020-10-07 ENCOUNTER — Encounter
Admission: RE | Disposition: A | Discharge status: Home / Self Care | Payer: Self-pay | Source: Ambulatory Visit | Attending: Ophthalmology

## 2020-10-07 ENCOUNTER — Other Ambulatory Visit: Payer: Self-pay

## 2020-10-07 ENCOUNTER — Encounter: Payer: Self-pay | Admitting: Ophthalmology

## 2020-10-07 ENCOUNTER — Ambulatory Visit: Payer: Medicare Other | Admitting: Anesthesiology

## 2020-10-07 ENCOUNTER — Ambulatory Visit
Admission: RE | Admit: 2020-10-07 | Discharge: 2020-10-07 | Disposition: A | Discharge status: Home / Self Care | Payer: Medicare Other | Source: Ambulatory Visit | Attending: Ophthalmology | Admitting: Ophthalmology

## 2020-10-07 DIAGNOSIS — Z7901 Long term (current) use of anticoagulants: Secondary | ICD-10-CM | POA: Insufficient documentation

## 2020-10-07 DIAGNOSIS — Z951 Presence of aortocoronary bypass graft: Secondary | ICD-10-CM | POA: Diagnosis not present

## 2020-10-07 DIAGNOSIS — H2511 Age-related nuclear cataract, right eye: Secondary | ICD-10-CM | POA: Diagnosis not present

## 2020-10-07 DIAGNOSIS — Z96642 Presence of left artificial hip joint: Secondary | ICD-10-CM | POA: Diagnosis not present

## 2020-10-07 DIAGNOSIS — H25811 Combined forms of age-related cataract, right eye: Secondary | ICD-10-CM | POA: Diagnosis not present

## 2020-10-07 DIAGNOSIS — Z7982 Long term (current) use of aspirin: Secondary | ICD-10-CM | POA: Diagnosis not present

## 2020-10-07 DIAGNOSIS — Z79899 Other long term (current) drug therapy: Secondary | ICD-10-CM | POA: Insufficient documentation

## 2020-10-07 HISTORY — PX: CATARACT EXTRACTION W/PHACO: SHX586

## 2020-10-07 SURGERY — PHACOEMULSIFICATION, CATARACT, WITH IOL INSERTION
Anesthesia: Monitor Anesthesia Care | Site: Eye | Laterality: Right

## 2020-10-07 MED ORDER — CYCLOPENTOLATE HCL 2 % OP SOLN
1.0000 [drp] | OPHTHALMIC | Status: DC | PRN
  Administered 2020-10-07 (×3): 1 [drp] via OPHTHALMIC

## 2020-10-07 MED ORDER — NA CHONDROIT SULF-NA HYALURON 40-17 MG/ML IO SOLN
INTRAOCULAR | Status: DC | PRN
  Administered 2020-10-07: 1 mL via INTRAOCULAR

## 2020-10-07 MED ORDER — LIDOCAINE HCL (PF) 2 % IJ SOLN
INTRAOCULAR | Status: DC | PRN
  Administered 2020-10-07: 1 mL

## 2020-10-07 MED ORDER — MOXIFLOXACIN HCL 0.5 % OP SOLN
OPHTHALMIC | Status: DC | PRN
  Administered 2020-10-07: 0.2 mL via OPHTHALMIC

## 2020-10-07 MED ORDER — MIDAZOLAM HCL 2 MG/2ML IJ SOLN
INTRAMUSCULAR | Status: DC | PRN
  Administered 2020-10-07: 1 mg via INTRAVENOUS

## 2020-10-07 MED ORDER — BRIMONIDINE TARTRATE-TIMOLOL 0.2-0.5 % OP SOLN
OPHTHALMIC | Status: DC | PRN
  Administered 2020-10-07: 1 [drp] via OPHTHALMIC

## 2020-10-07 MED ORDER — TETRACAINE HCL 0.5 % OP SOLN
1.0000 [drp] | OPHTHALMIC | Status: DC | PRN
  Administered 2020-10-07 (×3): 1 [drp] via OPHTHALMIC

## 2020-10-07 MED ORDER — FENTANYL CITRATE (PF) 100 MCG/2ML IJ SOLN
INTRAMUSCULAR | Status: DC | PRN
  Administered 2020-10-07: 50 ug via INTRAVENOUS

## 2020-10-07 MED ORDER — LACTATED RINGERS IV SOLN: INTRAVENOUS | Status: DC

## 2020-10-07 MED ORDER — PHENYLEPHRINE HCL 10 % OP SOLN
1.0000 [drp] | OPHTHALMIC | Status: DC | PRN
  Administered 2020-10-07 (×3): 1 [drp] via OPHTHALMIC

## 2020-10-07 MED ORDER — EPINEPHRINE PF 1 MG/ML IJ SOLN
INTRAOCULAR | Status: DC | PRN
  Administered 2020-10-07: 53 mL via OPHTHALMIC

## 2020-10-07 SURGICAL SUPPLY — 17 items
CANNULA ANT/CHMB 27G (MISCELLANEOUS) ×2 IMPLANT
CANNULA ANT/CHMB 27GA (MISCELLANEOUS) ×4 IMPLANT
GLOVE SURG TRIUMPH 8.0 PF LTX (GLOVE) ×2 IMPLANT
GOWN STRL REUS W/ TWL LRG LVL3 (GOWN DISPOSABLE) ×2 IMPLANT
GOWN STRL REUS W/TWL LRG LVL3 (GOWN DISPOSABLE) ×4
LENS IOL TECNIS EYHANCE 19.5 (Intraocular Lens) ×1 IMPLANT
MARKER SKIN DUAL TIP RULER LAB (MISCELLANEOUS) ×2 IMPLANT
NDL FILTER BLUNT 18X1 1/2 (NEEDLE) ×1 IMPLANT
NEEDLE FILTER BLUNT 18X 1/2SAF (NEEDLE) ×1
NEEDLE FILTER BLUNT 18X1 1/2 (NEEDLE) ×1 IMPLANT
PACK EYE AFTER SURG (MISCELLANEOUS) ×2 IMPLANT
PACK OPTHALMIC (MISCELLANEOUS) ×2 IMPLANT
PACK PORFILIO (MISCELLANEOUS) ×2 IMPLANT
SYR 3ML LL SCALE MARK (SYRINGE) ×2 IMPLANT
SYR TB 1ML LUER SLIP (SYRINGE) ×2 IMPLANT
WATER STERILE IRR 250ML POUR (IV SOLUTION) ×2 IMPLANT
WIPE NON LINTING 3.25X3.25 (MISCELLANEOUS) ×2 IMPLANT

## 2020-10-07 NOTE — Transfer of Care (Signed)
Immediate Anesthesia Transfer of Care Note  Patient: Bruce Moody  Procedure(s) Performed: CATARACT EXTRACTION PHACO AND INTRAOCULAR LENS PLACEMENT (IOC) RIGHT 8.57 00:43.5 (Right Eye)  Patient Location: PACU  Anesthesia Type: MAC  Level of Consciousness: awake, alert  and patient cooperative  Airway and Oxygen Therapy: Patient Spontanous Breathing and Patient connected to supplemental oxygen  Post-op Assessment: Post-op Vital signs reviewed, Patient's Cardiovascular Status Stable, Respiratory Function Stable, Patent Airway and No signs of Nausea or vomiting  Post-op Vital Signs: Reviewed and stable  Complications: No complications documented.

## 2020-10-07 NOTE — H&P (Signed)
[facility]   Primary Care Physician:  Birdie Sons, MD Ophthalmologist: Dr. George Ina  Pre-Procedure History & Physical: HPI:  Bruce Moody is a 70 y.o. male here for cataract surgery.   Past Medical History:  Diagnosis Date  . Arthritis    right hip  . Cancer [facility]) 2011   Melanoma  . Coronary artery disease   . Elevated lipids   . Heart disease   . Hyperlipidemia   . Hypertension   . Myocardial infarction [facility])     Past Surgical History:  Procedure Laterality Date  . BASAL CELL CARCINOMA EXCISION     right eyelid  . CARDIAC CATHETERIZATION    . COLONOSCOPY  2004  . CORONARY ANGIOPLASTY    . CORONARY ARTERY BYPASS GRAFT  1998  . CORONARY ARTERY BYPASS GRAFT     3 vessels  . HERNIA REPAIR Left 2008   inguinal  . HERNIA REPAIR Right 07-05-13   inguinal   . ROTATOR CUFF REPAIR  2005  . TONSILLECTOMY    . TOTAL HIP ARTHROPLASTY Left 06/08/2016   Procedure: TOTAL HIP ARTHROPLASTY ANTERIOR APPROACH;  Surgeon: Hessie Knows, MD;  Location: [facility] ORS;  Service: Orthopedics;  Laterality: Left;    Prior to Admission medications   Medication Sig Start Date End Date Taking? Authorizing Provider  acetaminophen (TYLENOL) 500 MG tablet Take 1,000 mg by mouth every 6 (six) hours as needed (for pain.).   Yes [provider]  aspirin EC 81 MG tablet Take 81 mg by mouth daily.   Yes [provider]  atorvastatin (LIPITOR) 80 MG tablet Take 1 tablet (80 mg total) by mouth daily. 12/16/17  Yes Birdie Sons, MD  carvedilol (COREG) 3.125 MG tablet Take 3.125 mg by mouth 2 (two) times daily with a meal.  04/03/13  Yes [provider]  clopidogrel (PLAVIX) 75 MG tablet Take 75 mg by mouth daily.  04/03/13  Yes [provider]  ibuprofen (ADVIL,MOTRIN) 200 MG tablet Take 200 mg by mouth every 8 (eight) hours as needed (for pain.).   Yes [provider]  ramipril (ALTACE) 2.5 MG capsule Take 2.5 mg by mouth at bedtime.  04/03/13  Yes  [provider]    Allergies as of 08/26/2020  . (No Known Allergies)    Family History  Problem Relation Age of Onset  . Lymphoma Father   . Heart disease Mother   . Dementia Mother   . Arthritis Sister   . Heart disease Sister   . COPD Sister     Social History   Socioeconomic History  . Marital status: Married    Spouse name: Not on file  . Number of children: 1  . Years of education: Not on file  . Highest education level: Bachelor's degree (e.g., BA, AB, BS)  Occupational History  . Not on file  Tobacco Use  . Smoking status: Former Smoker    Types: Cigarettes    Quit date: 07/19/1996    Years since quitting: 24.2  . Smokeless tobacco: Never Used  Vaping Use  . Vaping Use: Never used  Substance and Sexual Activity  . Alcohol use: Yes    Alcohol/week: 5.0 - 6.0 standard drinks    Types: 5 - 6 Glasses of wine per week  . Drug use: No  . Sexual activity: Not on file  Other Topics Concern  . Not on file  Social History Narrative  . Not on file   Social Determinants of  Health   Financial Resource Strain: Low Risk   . Difficulty of Paying Living Expenses: Not hard at all  Food Insecurity: No Food Insecurity  . Worried About Charity fundraiser in the Last Year: Never true  . Ran Out of Food in the Last Year: Never true  Transportation Needs: No Transportation Needs  . Lack of Transportation (Medical): No  . Lack of Transportation (Non-Medical): No  Physical Activity: Inactive  . Days of Exercise per Week: 0 days  . Minutes of Exercise per Session: 0 min  Stress: No Stress Concern Present  . Feeling of Stress : Not at all  Social Connections: Moderately Integrated  . Frequency of Communication with Friends and Family: Three times a week  . Frequency of Social Gatherings with Friends and Family: More than three times a week  . Attends Religious Services: More than 4 times per year  . Active Member of Clubs or Organizations: No  . Attends Theatre manager Meetings: Never  . Marital Status: Married  Human resources officer Violence: Not At Risk  . Fear of Current or Ex-Partner: No  . Emotionally Abused: No  . Physically Abused: No  . Sexually Abused: No    Review of Systems: See HPI, otherwise negative ROS  Physical Exam: BP (!) 143/77   Pulse 62   Temp (!) 97.2 F (36.2 C) (Temporal)   Resp 16   Ht 5\' 2"  (1.575 m)   Wt 61.2 kg   SpO2 99%   BMI 24.69 kg/m  General:   Alert,  pleasant and cooperative in NAD Head:  Normocephalic and atraumatic. Respiratory:  Normal work of breathing.  Impression/Plan: Bruce Moody is here for cataract surgery.  Risks, benefits, limitations, and alternatives regarding cataract surgery have been reviewed with the patient.  Questions have been answered.  All parties agreeable.   Birder Robson, MD  10/07/2020, 9:39 AM

## 2020-10-07 NOTE — Anesthesia Postprocedure Evaluation (Signed)
Anesthesia Post Note  Patient: Bruce Moody  Procedure(s) Performed: CATARACT EXTRACTION PHACO AND INTRAOCULAR LENS PLACEMENT (IOC) RIGHT 8.57 00:43.5 (Right Eye)     Patient location during evaluation: PACU Anesthesia Type: MAC Level of consciousness: awake and alert Pain management: pain level controlled Vital Signs Assessment: post-procedure vital signs reviewed and stable Respiratory status: spontaneous breathing, nonlabored ventilation, respiratory function stable and patient connected to nasal cannula oxygen Cardiovascular status: stable and blood pressure returned to baseline Postop Assessment: no apparent nausea or vomiting Anesthetic complications: no   No complications documented.  Wanda Plump Fanny Agan

## 2020-10-07 NOTE — Op Note (Signed)
PREOPERATIVE DIAGNOSIS:  Nuclear sclerotic cataract of the right eye.   POSTOPERATIVE DIAGNOSIS:  H25.11 Cataract   OPERATIVE PROCEDURE:@   SURGEON:  Birder Robson, MD.   ANESTHESIA:  Anesthesiologist: Carlos American, MD CRNA: Cameron Ali, CRNA  1.      Managed anesthesia care. 2.      0.38ml of Shugarcaine was instilled in the eye following the paracentesis.   COMPLICATIONS:  None.   TECHNIQUE:   Stop and chop   DESCRIPTION OF PROCEDURE:  The patient was examined and consented in the preoperative holding area where the aforementioned topical anesthesia was applied to the right eye and then brought back to the Operating Room where the right eye was prepped and draped in the usual sterile ophthalmic fashion and a lid speculum was placed. A paracentesis was created with the side port blade and the anterior chamber was filled with viscoelastic. A near clear corneal incision was performed with the steel keratome. A continuous curvilinear capsulorrhexis was performed with a cystotome followed by the capsulorrhexis forceps. Hydrodissection and hydrodelineation were carried out with BSS on a blunt cannula. The lens was removed in a stop and chop  technique and the remaining cortical material was removed with the irrigation-aspiration handpiece. The capsular bag was inflated with viscoelastic and the Technis [id number]  lens was placed in the capsular bag without complication. The remaining viscoelastic was removed from the eye with the irrigation-aspiration handpiece. The wounds were hydrated. The anterior chamber was flushed with BSS and the eye was inflated to physiologic pressure. 0.67ml of Vigamox was placed in the anterior chamber. The wounds were found to be water tight. The eye was dressed with Combigan. The patient was given protective glasses to wear throughout the day and a shield with which to sleep tonight. The patient was also given drops with which to begin a drop regimen today and will  follow-up with me in one day. Implant Name Type Inv. Item Serial No. Manufacturer Lot No. LRB No. Used Action  LENS IOL TECNIS EYHANCE 19.5 - [id number] Intraocular Lens LENS IOL TECNIS EYHANCE 19.5 [id number] JOHNSON   Right 1 Implanted   Procedure(s): CATARACT EXTRACTION PHACO AND INTRAOCULAR LENS PLACEMENT (IOC) RIGHT 8.57 00:43.5 (Right)  Electronically signed: Birder Robson 10/07/2020 10:03 AM

## 2020-10-07 NOTE — Anesthesia Procedure Notes (Signed)
Procedure Name: MAC Date/Time: 10/07/2020 9:46 AM Performed by: Cameron Ali, CRNA Pre-anesthesia Checklist: Patient identified, Emergency Drugs available, Suction available, Timeout performed and Patient being monitored Patient Re-evaluated:Patient Re-evaluated prior to induction Oxygen Delivery Method: Nasal cannula Placement Confirmation: positive ETCO2

## 2020-10-07 NOTE — Anesthesia Preprocedure Evaluation (Signed)
Anesthesia Evaluation  Patient identified by MRN, date of birth, ID band Patient awake    Reviewed: Allergy & Precautions, NPO status , Patient's Chart, lab work & pertinent test results  History of Anesthesia Complications Negative for: history of anesthetic complications  Airway Mallampati: III  TM Distance: >3 FB Neck ROM: Limited    Dental   Pulmonary former smoker,    breath sounds clear to auscultation       Cardiovascular hypertension, + CAD, + Past MI, + Cardiac Stents and + CABG   Rhythm:Regular Rate:Normal     Neuro/Psych    GI/Hepatic   Endo/Other    Renal/GU      Musculoskeletal  (+) Arthritis ,   Abdominal   Peds  Hematology   Anesthesia Other Findings   Reproductive/Obstetrics                             Anesthesia Physical Anesthesia Plan  ASA: III  Anesthesia Plan: MAC   Post-op Pain Management:    Induction: Intravenous  PONV Risk Score and Plan:   Airway Management Planned: Nasal Cannula  Additional Equipment:   Intra-op Plan:   Post-operative Plan:   Informed Consent: I have reviewed the patients History and Physical, chart, labs and discussed the procedure including the risks, benefits and alternatives for the proposed anesthesia with the patient or authorized representative who has indicated his/her understanding and acceptance.       Plan Discussed with: CRNA  Anesthesia Plan Comments:         Anesthesia Quick Evaluation

## 2020-10-08 ENCOUNTER — Encounter: Payer: Self-pay | Admitting: Ophthalmology

## 2021-01-22 DIAGNOSIS — X32XXXA Exposure to sunlight, initial encounter: Secondary | ICD-10-CM | POA: Diagnosis not present

## 2021-01-22 DIAGNOSIS — D485 Neoplasm of uncertain behavior of skin: Secondary | ICD-10-CM | POA: Diagnosis not present

## 2021-01-22 DIAGNOSIS — D0439 Carcinoma in situ of skin of other parts of face: Secondary | ICD-10-CM | POA: Diagnosis not present

## 2021-01-22 DIAGNOSIS — L570 Actinic keratosis: Secondary | ICD-10-CM | POA: Diagnosis not present

## 2021-01-28 DIAGNOSIS — M25551 Pain in right hip: Secondary | ICD-10-CM | POA: Diagnosis not present

## 2021-01-28 DIAGNOSIS — G8929 Other chronic pain: Secondary | ICD-10-CM | POA: Diagnosis not present

## 2021-01-29 DIAGNOSIS — Z23 Encounter for immunization: Secondary | ICD-10-CM | POA: Diagnosis not present

## 2021-02-04 DIAGNOSIS — D0439 Carcinoma in situ of skin of other parts of face: Secondary | ICD-10-CM | POA: Diagnosis not present

## 2021-02-05 ENCOUNTER — Other Ambulatory Visit: Payer: Self-pay | Admitting: Orthopedic Surgery

## 2021-02-18 DIAGNOSIS — Z01818 Encounter for other preprocedural examination: Secondary | ICD-10-CM | POA: Diagnosis not present

## 2021-02-18 DIAGNOSIS — I255 Ischemic cardiomyopathy: Secondary | ICD-10-CM | POA: Diagnosis not present

## 2021-02-18 DIAGNOSIS — I10 Essential (primary) hypertension: Secondary | ICD-10-CM | POA: Diagnosis not present

## 2021-02-18 DIAGNOSIS — I25810 Atherosclerosis of coronary artery bypass graft(s) without angina pectoris: Secondary | ICD-10-CM | POA: Diagnosis not present

## 2021-02-24 ENCOUNTER — Encounter
Admission: RE | Admit: 2021-02-24 | Discharge: 2021-02-24 | Disposition: A | Discharge status: Home / Self Care | Payer: Medicare Other | Source: Ambulatory Visit | Attending: Orthopedic Surgery | Admitting: Orthopedic Surgery

## 2021-02-24 ENCOUNTER — Other Ambulatory Visit: Payer: Self-pay

## 2021-02-24 DIAGNOSIS — I4510 Unspecified right bundle-branch block: Secondary | ICD-10-CM | POA: Insufficient documentation

## 2021-02-24 DIAGNOSIS — Z01810 Encounter for preprocedural cardiovascular examination: Secondary | ICD-10-CM | POA: Diagnosis not present

## 2021-02-24 DIAGNOSIS — Z01818 Encounter for other preprocedural examination: Secondary | ICD-10-CM | POA: Insufficient documentation

## 2021-02-24 HISTORY — DX: Personal history of urinary calculi: Z87.442

## 2021-02-24 LAB — COMPREHENSIVE METABOLIC PANEL
ALT: 37 U/L (ref 0–44)
AST: 44 U/L — ABNORMAL HIGH (ref 15–41)
Albumin: 3.8 g/dL (ref 3.5–5.0)
Alkaline Phosphatase: 132 U/L — ABNORMAL HIGH (ref 38–126)
Anion gap: 9 (ref 5–15)
BUN: 13 mg/dL (ref 8–23)
CO2: 27 mmol/L (ref 22–32)
Calcium: 8.6 mg/dL — ABNORMAL LOW (ref 8.9–10.3)
Chloride: 102 mmol/L (ref 98–111)
Creatinine, Ser: 0.81 mg/dL (ref 0.61–1.24)
GFR, Estimated: >60 mL/min (ref >60)
Glucose, Bld: 100 mg/dL — ABNORMAL HIGH (ref 70–99)
Potassium: 3.8 mmol/L (ref 3.5–5.1)
Sodium: 138 mmol/L (ref 135–145)
Total Bilirubin: 1.1 mg/dL (ref 0.3–1.2)
Total Protein: 7 g/dL (ref 6.5–8.1)

## 2021-02-24 LAB — CBC WITH DIFFERENTIAL/PLATELET
Abs Immature Granulocytes: 0.01 10*3/uL (ref 0.00–0.07)
Basophils Absolute: 0.1 10*3/uL (ref 0.0–0.1)
Basophils Relative: 1 %
Eosinophils Absolute: 0.3 10*3/uL (ref 0.0–0.5)
Eosinophils Relative: 5 %
HCT: 39.4 % (ref 39.0–52.0)
Hemoglobin: 13.4 g/dL (ref 13.0–17.0)
Immature Granulocytes: 0 %
Lymphocytes Relative: 28 %
Lymphs Abs: 1.7 10*3/uL (ref 0.7–4.0)
MCH: 34 pg (ref 26.0–34.0)
MCHC: 34 g/dL (ref 30.0–36.0)
MCV: 100 fL (ref 80.0–100.0)
Monocytes Absolute: 0.7 10*3/uL (ref 0.1–1.0)
Monocytes Relative: 11 %
Neutro Abs: 3.4 10*3/uL (ref 1.7–7.7)
Neutrophils Relative %: 55 %
Platelets: 305 10*3/uL (ref 150–400)
RBC: 3.94 MIL/uL — ABNORMAL LOW (ref 4.22–5.81)
RDW: 12.3 % (ref 11.5–15.5)
WBC: 6.1 10*3/uL (ref 4.0–10.5)
nRBC: 0 % (ref 0.0–0.2)

## 2021-02-24 LAB — URINALYSIS, ROUTINE W REFLEX MICROSCOPIC
Bilirubin Urine: NEGATIVE
Glucose, UA: NEGATIVE mg/dL
Hgb urine dipstick: NEGATIVE
Ketones, ur: 5 mg/dL — AB
Leukocytes,Ua: NEGATIVE
Nitrite: NEGATIVE
Protein, ur: NEGATIVE mg/dL
Specific Gravity, Urine: 1.026 (ref 1.005–1.030)
pH: 6 (ref 5.0–8.0)

## 2021-02-24 LAB — SURGICAL PCR SCREEN
MRSA, PCR: NEGATIVE
Staphylococcus aureus: NEGATIVE

## 2021-02-24 LAB — TYPE AND SCREEN
ABO/RH(D): O POS
Antibody Screen: NEGATIVE

## 2021-02-24 LAB — PROTIME-INR
INR: 0.9 (ref 0.8–1.2)
Prothrombin Time: 12.4 seconds (ref 11.4–15.2)

## 2021-02-24 LAB — APTT: aPTT: 31 seconds (ref 24–36)

## 2021-02-24 NOTE — Patient Instructions (Addendum)
Your procedure is scheduled on: Tuesday March 03, 2021. Report to Day Surgery inside [facility] 2nd floor stop by admissions desk before getting on elevator. To find out your arrival time please call [phone] between 1PM - 3PM on Monday March 02, 2021.  Remember: Instructions that are not followed completely may result in serious medical risk,  up to and including death, or upon the discretion of your surgeon and anesthesiologist your  surgery may need to be rescheduled.     _X__ 1. Do not eat food after midnight the night before your procedure.                 No chewing gum or hard candies. You may drink clear liquids up to 2 hours                 before you are scheduled to arrive for your surgery- DO not drink clear                 liquids within 2 hours of the start of your surgery.                 Clear Liquids include:  water, apple juice without pulp, clear Gatorade, G2 or                  Gatorade Zero (avoid Red/Purple/Blue), Black Coffee or Tea (Do not add                 anything to coffee or tea).  __X__2.   Complete the "Ensure Clear Pre-surgery Clear Carbohydrate Drink" provided to you, 2 hours before arrival. **If you are diabetic you will be provided with an alternative drink, Gatorade Zero or G2.  __X__3.  On the morning of surgery brush your teeth with toothpaste and water, you                may rinse your mouth with mouthwash if you wish.  Do not swallow any toothpaste of mouthwash.     _X__ 4.  No Alcohol for 24 hours before or after surgery.   _X__ 5.  Do Not Smoke or use e-cigarettes For 24 Hours Prior to Your Surgery.                 Do not use any chewable tobacco products for at least 6 hours prior to                 Surgery.  _X__  6.  Do not use any recreational drugs (marijuana, cocaine, heroin, ecstasy, MDMA or other)                For at least one week prior to your surgery.  Combination of these drugs with anesthesia                 May have life threatening results.  _X___  7.  Notify your doctor if there is any change in your medical condition      (cold, fever, infections).     Do not wear jewelry, make-up, hairpins, clips or nail polish. Do not wear lotions, powders, or perfumes. You may wear deodorant. Do not shave 48 hours prior to surgery. Men may shave face and neck. Do not bring valuables to the hospital.    [facility] is not responsible for any belongings or valuables.  Contacts, dentures or bridgework may not be worn into surgery. Leave your suitcase in the car. After surgery  it may be brought to your room. For patients admitted to the hospital, discharge time is determined by your treatment team.   Patients discharged the day of surgery will not be allowed to drive home.   Make arrangements for someone to be with you for the first 24 hours of your Same Day Discharge.    Please read over the following fact sheets that you were given:   Total Joint Packet    __X__ Take these medicines the morning of surgery with A SIP OF WATER:    1. carvedilol (COREG) 3.125 MG   2.   3.   4.  5.  6.  ____ Fleet Enema (as directed)   __X__ Use CHG Soap (or wipes) as directed  ____ Use Benzoyl Peroxide Gel as instructed  ____ Use inhalers on the day of surgery  ____ Stop metformin 2 days prior to surgery    ____ Take 1/2 of usual insulin dose the night before surgery. No insulin the morning          of surgery.   __X__ Stop clopidogrel (PLAVIX) 75 MG Thursday 8/11 before your procedure and ask Dr. Rudene Christians when he wants you to stop your aspirin 81 mg  before  your surgery.   __X__ One Week prior to surgery- Stop Anti-inflammatories such as Ibuprofen, Aleve, Advil, Motrin, meloxicam (MOBIC), diclofenac, etodolac, ketorolac, Toradol, Daypro, piroxicam, Goody's or BC powders. OK TO USE TYLENOL IF NEEDED   __X__ Stop supplements until after surgery.    ____ Bring C-Pap to the hospital.    If  you have any questions regarding your pre-procedure instructions,  Please call Pre-admit Testing at [phone]

## 2021-02-27 ENCOUNTER — Other Ambulatory Visit: Payer: Self-pay

## 2021-02-27 ENCOUNTER — Other Ambulatory Visit
Admission: RE | Admit: 2021-02-27 | Discharge: 2021-02-27 | Disposition: A | Discharge status: Home / Self Care | Payer: Medicare Other | Source: Ambulatory Visit | Attending: Orthopedic Surgery | Admitting: Orthopedic Surgery

## 2021-02-27 DIAGNOSIS — Z20822 Contact with and (suspected) exposure to covid-19: Secondary | ICD-10-CM | POA: Insufficient documentation

## 2021-02-27 DIAGNOSIS — Z01812 Encounter for preprocedural laboratory examination: Secondary | ICD-10-CM | POA: Insufficient documentation

## 2021-02-27 LAB — SARS CORONAVIRUS 2 (TAT 6-24 HRS): SARS Coronavirus 2: NEGATIVE

## 2021-03-02 MED ORDER — CEFAZOLIN SODIUM-DEXTROSE 2-4 GM/100ML-% IV SOLN
2.0000 g | INTRAVENOUS | Status: AC
  Administered 2021-03-03: 2 g via INTRAVENOUS

## 2021-03-03 ENCOUNTER — Ambulatory Visit: Payer: Medicare Other | Admitting: Certified Registered"

## 2021-03-03 ENCOUNTER — Observation Stay: Payer: Medicare Other

## 2021-03-03 ENCOUNTER — Encounter: Payer: Self-pay | Admitting: Orthopedic Surgery

## 2021-03-03 ENCOUNTER — Other Ambulatory Visit: Payer: Self-pay

## 2021-03-03 ENCOUNTER — Observation Stay
Admission: RE | Admit: 2021-03-03 | Discharge: 2021-03-04 | Disposition: A | Discharge status: Home / Self Care | Payer: Medicare Other | Attending: Orthopedic Surgery | Admitting: Orthopedic Surgery

## 2021-03-03 ENCOUNTER — Encounter
Admission: RE | Disposition: A | Discharge status: Home / Self Care | Payer: Self-pay | Source: Home / Self Care | Attending: Orthopedic Surgery

## 2021-03-03 ENCOUNTER — Ambulatory Visit: Payer: Medicare Other

## 2021-03-03 DIAGNOSIS — G8918 Other acute postprocedural pain: Secondary | ICD-10-CM

## 2021-03-03 DIAGNOSIS — M25551 Pain in right hip: Secondary | ICD-10-CM | POA: Diagnosis not present

## 2021-03-03 DIAGNOSIS — Z96641 Presence of right artificial hip joint: Secondary | ICD-10-CM

## 2021-03-03 DIAGNOSIS — I10 Essential (primary) hypertension: Secondary | ICD-10-CM | POA: Diagnosis not present

## 2021-03-03 DIAGNOSIS — M1611 Unilateral primary osteoarthritis, right hip: Principal | ICD-10-CM | POA: Insufficient documentation

## 2021-03-03 DIAGNOSIS — Z419 Encounter for procedure for purposes other than remedying health state, unspecified: Secondary | ICD-10-CM

## 2021-03-03 DIAGNOSIS — G8929 Other chronic pain: Secondary | ICD-10-CM | POA: Insufficient documentation

## 2021-03-03 DIAGNOSIS — Z87891 Personal history of nicotine dependence: Secondary | ICD-10-CM | POA: Diagnosis not present

## 2021-03-03 DIAGNOSIS — Z471 Aftercare following joint replacement surgery: Secondary | ICD-10-CM | POA: Diagnosis not present

## 2021-03-03 HISTORY — PX: TOTAL HIP ARTHROPLASTY: SHX124

## 2021-03-03 SURGERY — ARTHROPLASTY, HIP, TOTAL, ANTERIOR APPROACH
Anesthesia: General | Site: Hip | Laterality: Right

## 2021-03-03 MED ORDER — ONDANSETRON HCL 4 MG/2ML IJ SOLN: 4.0000 mg | Freq: Once | INTRAMUSCULAR | Status: DC | PRN

## 2021-03-03 MED ORDER — FENTANYL CITRATE (PF) 100 MCG/2ML IJ SOLN
INTRAMUSCULAR | Status: AC
  Administered 2021-03-03: 25 ug via INTRAVENOUS
  Filled 2021-03-03: qty 2

## 2021-03-03 MED ORDER — ALUM & MAG HYDROXIDE-SIMETH 200-200-20 MG/5ML PO SUSP: 30.0000 mL | ORAL | Status: DC | PRN

## 2021-03-03 MED ORDER — HYDROCODONE-ACETAMINOPHEN 5-325 MG PO TABS: 1.0000 | ORAL_TABLET | ORAL | Status: DC | PRN

## 2021-03-03 MED ORDER — DOCUSATE SODIUM 100 MG PO CAPS
100.0000 mg | ORAL_CAPSULE | Freq: Two times a day (BID) | ORAL | Status: DC
  Administered 2021-03-03 – 2021-03-04 (×3): 100 mg via ORAL
  Filled 2021-03-03 (×3): qty 1

## 2021-03-03 MED ORDER — RAMIPRIL 2.5 MG PO CAPS
2.5000 mg | ORAL_CAPSULE | Freq: Every day | ORAL | Status: DC
  Administered 2021-03-03: 2.5 mg via ORAL
  Filled 2021-03-03 (×2): qty 1

## 2021-03-03 MED ORDER — SODIUM CHLORIDE FLUSH 0.9 % IV SOLN
INTRAVENOUS | Status: AC
  Filled 2021-03-03: qty 40

## 2021-03-03 MED ORDER — SODIUM CHLORIDE (PF) 0.9 % IJ SOLN: INTRAMUSCULAR | Status: DC | PRN

## 2021-03-03 MED ORDER — BUPIVACAINE HCL (PF) 0.5 % IJ SOLN
INTRAMUSCULAR | Status: AC
  Filled 2021-03-03: qty 10

## 2021-03-03 MED ORDER — METHOCARBAMOL 1000 MG/10ML IJ SOLN
500.0000 mg | Freq: Four times a day (QID) | INTRAVENOUS | Status: DC | PRN
  Filled 2021-03-03: qty 5

## 2021-03-03 MED ORDER — HYDROCODONE-ACETAMINOPHEN 7.5-325 MG PO TABS
1.0000 | ORAL_TABLET | ORAL | Status: DC | PRN
  Filled 2021-03-03: qty 2

## 2021-03-03 MED ORDER — SODIUM CHLORIDE 0.9 % IV SOLN: INTRAVENOUS | Status: DC

## 2021-03-03 MED ORDER — PHENYLEPHRINE HCL (PRESSORS) 10 MG/ML IV SOLN
INTRAVENOUS | Status: DC | PRN
  Administered 2021-03-03 (×4): 100 ug via INTRAVENOUS

## 2021-03-03 MED ORDER — DIPHENHYDRAMINE HCL 12.5 MG/5ML PO ELIX: 12.5000 mg | ORAL_SOLUTION | ORAL | Status: DC | PRN

## 2021-03-03 MED ORDER — MORPHINE SULFATE (PF) 2 MG/ML IV SOLN: 0.5000 mg | INTRAVENOUS | Status: DC | PRN

## 2021-03-03 MED ORDER — ACETAMINOPHEN 10 MG/ML IV SOLN
1000.0000 mg | Freq: Once | INTRAVENOUS | Status: DC | PRN
  Administered 2021-03-03: 1000 mg via INTRAVENOUS

## 2021-03-03 MED ORDER — LACTATED RINGERS IV SOLN: INTRAVENOUS | Status: DC

## 2021-03-03 MED ORDER — DEXAMETHASONE SODIUM PHOSPHATE 10 MG/ML IJ SOLN
INTRAMUSCULAR | Status: AC
  Filled 2021-03-03: qty 1

## 2021-03-03 MED ORDER — CARVEDILOL 3.125 MG PO TABS
3.1250 mg | ORAL_TABLET | Freq: Two times a day (BID) | ORAL | Status: DC
  Administered 2021-03-04: 3.125 mg via ORAL
  Filled 2021-03-03 (×2): qty 1

## 2021-03-03 MED ORDER — FENTANYL CITRATE (PF) 100 MCG/2ML IJ SOLN
INTRAMUSCULAR | Status: AC
  Filled 2021-03-03: qty 2

## 2021-03-03 MED ORDER — KETAMINE HCL 50 MG/5ML IJ SOSY
PREFILLED_SYRINGE | INTRAMUSCULAR | Status: AC
  Filled 2021-03-03: qty 5

## 2021-03-03 MED ORDER — MENTHOL 3 MG MT LOZG
1.0000 | LOZENGE | OROMUCOSAL | Status: DC | PRN
  Filled 2021-03-03: qty 9

## 2021-03-03 MED ORDER — FLEET ENEMA 7-19 GM/118ML RE ENEM: 1.0000 | ENEMA | Freq: Once | RECTAL | Status: DC | PRN

## 2021-03-03 MED ORDER — MIDAZOLAM HCL 2 MG/2ML IJ SOLN
INTRAMUSCULAR | Status: AC
  Filled 2021-03-03: qty 2

## 2021-03-03 MED ORDER — SUGAMMADEX SODIUM 200 MG/2ML IV SOLN
INTRAVENOUS | Status: DC | PRN
  Administered 2021-03-03: 200 mg via INTRAVENOUS

## 2021-03-03 MED ORDER — PHENOL 1.4 % MT LIQD
1.0000 | OROMUCOSAL | Status: DC | PRN
  Filled 2021-03-03: qty 177

## 2021-03-03 MED ORDER — CEFAZOLIN SODIUM-DEXTROSE 1-4 GM/50ML-% IV SOLN
1.0000 g | Freq: Four times a day (QID) | INTRAVENOUS | Status: AC
  Administered 2021-03-03 (×2): 1 g via INTRAVENOUS
  Filled 2021-03-03 (×2): qty 50

## 2021-03-03 MED ORDER — CHLORHEXIDINE GLUCONATE 0.12 % MT SOLN
15.0000 mL | Freq: Once | OROMUCOSAL | Status: AC
Start: 2021-03-03 — End: 2021-03-03

## 2021-03-03 MED ORDER — OXYCODONE HCL 5 MG/5ML PO SOLN: 5.0000 mg | Freq: Once | ORAL | Status: DC | PRN

## 2021-03-03 MED ORDER — ORAL CARE MOUTH RINSE: 15.0000 mL | Freq: Once | OROMUCOSAL | Status: AC

## 2021-03-03 MED ORDER — CEFAZOLIN SODIUM-DEXTROSE 2-4 GM/100ML-% IV SOLN
INTRAVENOUS | Status: AC
  Filled 2021-03-03: qty 100

## 2021-03-03 MED ORDER — ROCURONIUM BROMIDE 10 MG/ML (PF) SYRINGE
PREFILLED_SYRINGE | INTRAVENOUS | Status: AC
  Filled 2021-03-03: qty 10

## 2021-03-03 MED ORDER — LIDOCAINE HCL (PF) 2 % IJ SOLN
INTRAMUSCULAR | Status: AC
  Filled 2021-03-03: qty 5

## 2021-03-03 MED ORDER — ASPIRIN 81 MG PO CHEW
81.0000 mg | CHEWABLE_TABLET | Freq: Two times a day (BID) | ORAL | Status: DC
  Administered 2021-03-03 – 2021-03-04 (×2): 81 mg via ORAL
  Filled 2021-03-03 (×2): qty 1

## 2021-03-03 MED ORDER — LIDOCAINE HCL (CARDIAC) PF 100 MG/5ML IV SOSY
PREFILLED_SYRINGE | INTRAVENOUS | Status: DC | PRN
  Administered 2021-03-03: 80 mg via INTRAVENOUS

## 2021-03-03 MED ORDER — METHOCARBAMOL 500 MG PO TABS
500.0000 mg | ORAL_TABLET | Freq: Four times a day (QID) | ORAL | Status: DC | PRN
  Administered 2021-03-03: 500 mg via ORAL
  Filled 2021-03-03: qty 1

## 2021-03-03 MED ORDER — ZOLPIDEM TARTRATE 5 MG PO TABS: 5.0000 mg | ORAL_TABLET | Freq: Every evening | ORAL | Status: DC | PRN

## 2021-03-03 MED ORDER — BUPIVACAINE LIPOSOME 1.3 % IJ SUSP
INTRAMUSCULAR | Status: AC
  Filled 2021-03-03: qty 20

## 2021-03-03 MED ORDER — PROPOFOL 1000 MG/100ML IV EMUL
INTRAVENOUS | Status: AC
  Filled 2021-03-03: qty 100

## 2021-03-03 MED ORDER — FAMOTIDINE 20 MG PO TABS: 20.0000 mg | ORAL_TABLET | Freq: Once | ORAL | Status: AC

## 2021-03-03 MED ORDER — BUPIVACAINE-EPINEPHRINE (PF) 0.25% -1:200000 IJ SOLN
INTRAMUSCULAR | Status: AC
  Filled 2021-03-03: qty 30

## 2021-03-03 MED ORDER — POLYETHYLENE GLYCOL 3350 17 G PO PACK: 17.0000 g | PACK | Freq: Every day | ORAL | Status: DC | PRN

## 2021-03-03 MED ORDER — NEOMYCIN-POLYMYXIN B GU 40-200000 IR SOLN
Status: AC
  Filled 2021-03-03: qty 4

## 2021-03-03 MED ORDER — ONDANSETRON HCL 4 MG/2ML IJ SOLN
INTRAMUSCULAR | Status: DC | PRN
  Administered 2021-03-03: 4 mg via INTRAVENOUS

## 2021-03-03 MED ORDER — ATORVASTATIN CALCIUM 20 MG PO TABS
80.0000 mg | ORAL_TABLET | Freq: Every day | ORAL | Status: DC
  Administered 2021-03-03 – 2021-03-04 (×2): 80 mg via ORAL
  Filled 2021-03-03 (×2): qty 4

## 2021-03-03 MED ORDER — FAMOTIDINE 20 MG PO TABS
ORAL_TABLET | ORAL | Status: AC
  Administered 2021-03-03: 20 mg via ORAL
  Filled 2021-03-03: qty 1

## 2021-03-03 MED ORDER — CLOPIDOGREL BISULFATE 75 MG PO TABS
75.0000 mg | ORAL_TABLET | Freq: Every day | ORAL | Status: DC
  Administered 2021-03-03 – 2021-03-04 (×2): 75 mg via ORAL
  Filled 2021-03-03 (×2): qty 1

## 2021-03-03 MED ORDER — SODIUM CHLORIDE 0.9 % IR SOLN: Status: DC | PRN

## 2021-03-03 MED ORDER — ONDANSETRON HCL 4 MG PO TABS: 4.0000 mg | ORAL_TABLET | Freq: Four times a day (QID) | ORAL | Status: DC | PRN

## 2021-03-03 MED ORDER — OXYCODONE HCL 5 MG PO TABS: 5.0000 mg | ORAL_TABLET | Freq: Once | ORAL | Status: DC | PRN

## 2021-03-03 MED ORDER — ONDANSETRON HCL 4 MG/2ML IJ SOLN
INTRAMUSCULAR | Status: AC
  Filled 2021-03-03: qty 2

## 2021-03-03 MED ORDER — KETAMINE HCL 10 MG/ML IJ SOLN
INTRAMUSCULAR | Status: DC | PRN
  Administered 2021-03-03: 35 mg via INTRAVENOUS

## 2021-03-03 MED ORDER — BISACODYL 5 MG PO TBEC: 5.0000 mg | DELAYED_RELEASE_TABLET | Freq: Every day | ORAL | Status: DC | PRN

## 2021-03-03 MED ORDER — FENTANYL CITRATE (PF) 100 MCG/2ML IJ SOLN
25.0000 ug | INTRAMUSCULAR | Status: AC | PRN
  Administered 2021-03-03 (×4): 25 ug via INTRAVENOUS

## 2021-03-03 MED ORDER — TRAMADOL HCL 50 MG PO TABS
50.0000 mg | ORAL_TABLET | Freq: Four times a day (QID) | ORAL | Status: DC
  Administered 2021-03-03 – 2021-03-04 (×5): 50 mg via ORAL
  Filled 2021-03-03 (×5): qty 1

## 2021-03-03 MED ORDER — MIDAZOLAM HCL 2 MG/2ML IJ SOLN
INTRAMUSCULAR | Status: DC | PRN
  Administered 2021-03-03: 2 mg via INTRAVENOUS

## 2021-03-03 MED ORDER — ACETAMINOPHEN 325 MG PO TABS: 325.0000 mg | ORAL_TABLET | Freq: Four times a day (QID) | ORAL | Status: DC | PRN

## 2021-03-03 MED ORDER — ROCURONIUM BROMIDE 100 MG/10ML IV SOLN
INTRAVENOUS | Status: DC | PRN
  Administered 2021-03-03: 50 mg via INTRAVENOUS

## 2021-03-03 MED ORDER — PROPOFOL 10 MG/ML IV BOLUS
INTRAVENOUS | Status: DC | PRN
  Administered 2021-03-03: 150 mg via INTRAVENOUS
  Administered 2021-03-03: 50 mg via INTRAVENOUS

## 2021-03-03 MED ORDER — ACETAMINOPHEN 10 MG/ML IV SOLN
INTRAVENOUS | Status: AC
  Filled 2021-03-03: qty 100

## 2021-03-03 MED ORDER — ONDANSETRON HCL 4 MG/2ML IJ SOLN: 4.0000 mg | Freq: Four times a day (QID) | INTRAMUSCULAR | Status: DC | PRN

## 2021-03-03 MED ORDER — DEXAMETHASONE SODIUM PHOSPHATE 10 MG/ML IJ SOLN
INTRAMUSCULAR | Status: DC | PRN
Start: 2021-03-03 — End: 2021-03-03
  Administered 2021-03-03: 10 mg via INTRAVENOUS

## 2021-03-03 MED ORDER — CHLORHEXIDINE GLUCONATE 0.12 % MT SOLN
OROMUCOSAL | Status: AC
  Administered 2021-03-03: 15 mL via OROMUCOSAL
  Filled 2021-03-03: qty 15

## 2021-03-03 MED ORDER — FENTANYL CITRATE (PF) 100 MCG/2ML IJ SOLN
INTRAMUSCULAR | Status: DC | PRN
  Administered 2021-03-03: 100 ug via INTRAVENOUS

## 2021-03-03 SURGICAL SUPPLY — 62 items
BLADE SAGITTAL AGGR TOOTH XLG (BLADE) ×2 IMPLANT
BNDG COHESIVE 6X5 TAN ST LF (GAUZE/BANDAGES/DRESSINGS) ×6 IMPLANT
CANISTER SUCT 1200ML W/VALVE (MISCELLANEOUS) ×2 IMPLANT
CANISTER WOUND CARE 500ML ATS (WOUND CARE) ×2 IMPLANT
CHLORAPREP W/TINT 26 (MISCELLANEOUS) ×2 IMPLANT
COVER BACK TABLE REUSABLE LG (DRAPES) ×2 IMPLANT
DRAPE 3/4 80X56 (DRAPES) ×6 IMPLANT
DRAPE C-ARM XRAY 36X54 (DRAPES) ×2 IMPLANT
DRAPE INCISE IOBAN 66X60 STRL (DRAPES) IMPLANT
DRAPE POUCH INSTRU U-SHP 10X18 (DRAPES) ×2 IMPLANT
DRESSING SURGICEL FIBRLLR 1X2 (HEMOSTASIS) ×2 IMPLANT
DRSG MEPILEX SACRM 8.7X9.8 (GAUZE/BANDAGES/DRESSINGS) ×2 IMPLANT
DRSG OPSITE POSTOP 4X8 (GAUZE/BANDAGES/DRESSINGS) ×4 IMPLANT
DRSG SURGICEL FIBRILLAR 1X2 (HEMOSTASIS) ×4
ELECT BLADE 6.5 EXT (BLADE) ×2 IMPLANT
ELECT REM PT RETURN 9FT ADLT (ELECTROSURGICAL) ×2
ELECTRODE REM PT RTRN 9FT ADLT (ELECTROSURGICAL) ×1 IMPLANT
GAUZE 4X4 16PLY ~~LOC~~+RFID DBL (SPONGE) ×2 IMPLANT
GLOVE SURG SYN 9.0  PF PI (GLOVE) ×2
GLOVE SURG SYN 9.0 PF PI (GLOVE) ×2 IMPLANT
GLOVE SURG UNDER POLY LF SZ9 (GLOVE) ×2 IMPLANT
GOWN SRG 2XL LVL 4 RGLN SLV (GOWNS) ×1 IMPLANT
GOWN STRL NON-REIN 2XL LVL4 (GOWNS) ×1
GOWN STRL REUS W/ TWL LRG LVL3 (GOWN DISPOSABLE) ×1 IMPLANT
GOWN STRL REUS W/TWL LRG LVL3 (GOWN DISPOSABLE) ×1
HEMOVAC 400CC 10FR (MISCELLANEOUS) IMPLANT
HIP DBL LINER 54X28 (Liner) ×2 IMPLANT
HIP FEM HD S 28 (Head) ×2 IMPLANT
HOLDER FOLEY CATH W/STRAP (MISCELLANEOUS) ×2 IMPLANT
HOOD PEEL AWAY FLYTE STAYCOOL (MISCELLANEOUS) ×2 IMPLANT
IRRIGATION SURGIPHOR STRL (IV SOLUTION) IMPLANT
KIT PREVENA INCISION MGT 13 (CANNISTER) ×2 IMPLANT
MANIFOLD NEPTUNE II (INSTRUMENTS) ×2 IMPLANT
MAT ABSORB  FLUID 56X50 GRAY (MISCELLANEOUS) ×1
MAT ABSORB FLUID 56X50 GRAY (MISCELLANEOUS) ×1 IMPLANT
NDL SAFETY ECLIPSE 18X1.5 (NEEDLE) ×1 IMPLANT
NEEDLE HYPO 18GX1.5 SHARP (NEEDLE) ×1
NEEDLE SPNL 20GX3.5 QUINCKE YW (NEEDLE) ×4 IMPLANT
NS IRRIG 1000ML POUR BTL (IV SOLUTION) ×2 IMPLANT
PACK HIP COMPR (MISCELLANEOUS) ×2 IMPLANT
SCALPEL PROTECTED #10 DISP (BLADE) ×4 IMPLANT
SHELL ACETABULAR SZ 54 DM (Shell) ×2 IMPLANT
SOL PREP PVP 2OZ (MISCELLANEOUS) ×2
SOLUTION PREP PVP 2OZ (MISCELLANEOUS) ×1 IMPLANT
SPONGE DRAIN TRACH 4X4 STRL 2S (GAUZE/BANDAGES/DRESSINGS) ×2 IMPLANT
SPONGE T-LAP 18X18 ~~LOC~~+RFID (SPONGE) ×4 IMPLANT
STAPLER SKIN PROX 35W (STAPLE) ×2 IMPLANT
STEM FEMORAL SZ0 STD COLLARED (Stem) ×2 IMPLANT
STRAP SAFETY 5IN WIDE (MISCELLANEOUS) ×2 IMPLANT
SUT DVC 2 QUILL PDO  T11 36X36 (SUTURE) ×1
SUT DVC 2 QUILL PDO T11 36X36 (SUTURE) ×1 IMPLANT
SUT SILK 0 (SUTURE) ×1
SUT SILK 0 30XBRD TIE 6 (SUTURE) ×1 IMPLANT
SUT V-LOC 90 ABS DVC 3-0 CL (SUTURE) ×2 IMPLANT
SUT VIC AB 1 CT1 36 (SUTURE) ×2 IMPLANT
SYR 20ML LL LF (SYRINGE) ×2 IMPLANT
SYR 30ML LL (SYRINGE) ×2 IMPLANT
SYR 50ML LL SCALE MARK (SYRINGE) ×4 IMPLANT
SYR BULB IRRIG 60ML STRL (SYRINGE) ×2 IMPLANT
TAPE MICROFOAM 4IN (TAPE) ×2 IMPLANT
TOWEL OR 17X26 4PK STRL BLUE (TOWEL DISPOSABLE) ×2 IMPLANT
TRAY FOLEY MTR SLVR 16FR STAT (SET/KITS/TRAYS/PACK) IMPLANT

## 2021-03-03 NOTE — Plan of Care (Signed)

## 2021-03-03 NOTE — Anesthesia Preprocedure Evaluation (Addendum)
Anesthesia Evaluation  Patient identified by MRN, date of birth, ID band Patient awake  General Assessment Comment:  Plavix last taken 5 days ago.  Reviewed: Allergy & Precautions, NPO status , Patient's Chart, lab work & pertinent test results  History of Anesthesia Complications Negative for: history of anesthetic complications  Airway Mallampati: III  TM Distance: >3 FB Neck ROM: Full    Dental no notable dental hx. (+) Teeth Intact   Pulmonary neg pulmonary ROS, neg sleep apnea, neg COPD, Patient abstained from smoking.Not current smoker, former smoker,    Pulmonary exam normal breath sounds clear to auscultation       Cardiovascular Exercise Tolerance: Good METShypertension, Pt. on medications + CAD, + Past MI, + Cardiac Stents and + CABG  (-) dysrhythmias  Rhythm:Regular Rate:Normal - Systolic murmurs Stress test 2021: FINDINGS:  Regional wall motion: demonstrates hypokinesis of the apical wall.  The overall quality of the study is good.   Artifacts noted: no  Left ventricular cavity: normal.   Perfusion Analysis: SPECT images demonstrate small perfusion abnormality  of mild intensity is present in the apical myocardial region on the stress  images. Resting images show small perfusion abnormality of mild intensity  of the apical myocardial region consistent with previous infarct and/or  scar without evidence of ischemia  defect type : Fixed   TTE 2021: INTERPRETATION  MILD LV SYSTOLIC DYSFUNCTION (See above)  NORMAL RIGHT VENTRICULAR SYSTOLIC FUNCTION  MILD VALVULAR REGURGITATION (See above)  NO VALVULAR STENOSIS  MILD TR, PR  TRIVIAL MR  EF 45%     Neuro/Psych negative neurological ROS  negative psych ROS   GI/Hepatic neg GERD  ,(+)     (-) substance abuse  ,   Endo/Other  neg diabetes  Renal/GU negative Renal ROS     Musculoskeletal  (+) Arthritis , Osteoarthritis,    Abdominal    Peds  Hematology   Anesthesia Other Findings Past Medical History: No date: Arthritis     Comment:  right hip 2011: Cancer ([place])     Comment:  Melanoma pn forehead 2017 and upper back 2011 No date: Coronary artery disease No date: Elevated lipids No date: Heart disease No date: History of kidney stones No date: Hyperlipidemia No date: Hypertension 1998: Myocardial infarction ([place])  Reproductive/Obstetrics                            Anesthesia Physical Anesthesia Plan  ASA: 3  Anesthesia Plan: General   Post-op Pain Management:    Induction: Intravenous  PONV Risk Score and Plan: 3 and Ondansetron and Dexamethasone  Airway Management Planned: Oral ETT  Additional Equipment: None  Intra-op Plan:   Post-operative Plan: Extubation in OR  Informed Consent: I have reviewed the patients History and Physical, chart, labs and discussed the procedure including the risks, benefits and alternatives for the proposed anesthesia with the patient or authorized representative who has indicated his/her understanding and acceptance.     Dental advisory given  Plan Discussed with: CRNA and Surgeon  Anesthesia Plan Comments: (Due to last plavix dose being 5 days ago (Thursday AM), safer to proceed with GETA. Discussed risks of anesthesia with patient, including PONV, sore throat, lip/dental damage. Rare risks discussed as well, such as cardiorespiratory and neurological sequelae, and allergic reactions. Patient understands.)        Anesthesia Quick Evaluation

## 2021-03-03 NOTE — Op Note (Signed)
03/03/2021  11:25 AM  PATIENT:  Bruce Moody  70 y.o. male  PRE-OPERATIVE DIAGNOSIS:  Chronic pain of right hip M25.551, G89.29  POST-OPERATIVE DIAGNOSIS:  Chronic pain of right hip M25.551, G89.29  PROCEDURE:  Procedure(s): TOTAL HIP ARTHROPLASTY ANTERIOR APPROACH (Right)  SURGEON: Laurene Footman, MD  ASSISTANTS: None  ANESTHESIA:   general  EBL:  Total I/O In: 1100 [I.V.:1000; IV Piggyback:100] Out: 200 [Blood:200]  BLOOD ADMINISTERED:none  DRAINS:  Incisional wound VAC    LOCAL MEDICATIONS USED:  MARCAINE    and OTHER Exparel  SPECIMEN: Right femoral head  DISPOSITION OF SPECIMEN:  PATHOLOGY  COUNTS:  YES  TOURNIQUET:  * No tourniquets in log *  IMPLANTS: Medacta AMIS 0 standard stem with 54 mm Mpact DM cup and liner with metal S 28 mm head  DICTATION: .Dragon Dictation   The patient was brought to the operating room and after general anesthesia was obtained patient was placed on the operative table with the ipsilateral foot into the Medacta attachment, contralateral leg on a well-padded table. C-arm was brought in and preop template x-ray taken. After prepping and draping in usual sterile fashion appropriate patient identification and timeout procedures were completed. Anterior approach to the hip was obtained and centered over the greater trochanter and TFL muscle. The subcutaneous tissue was incised hemostasis being achieved by electrocautery. TFL fascia was incised and the muscle retracted laterally deep retractor placed. The lateral femoral circumflex vessels were identified and ligated. The anterior capsule was exposed and a capsulotomy performed. The neck was identified and a femoral neck cut carried out with a saw. The head was removed without difficulty and showed sclerotic femoral head and acetabulum. Reaming was carried out to 52 mm and a 54 mm cup trial gave appropriate tightness to the acetabular component a 54 DM cup was impacted into position. The leg was  then externally rotated and ischiofemoral and pubofemoral releases carried out. The femur was sequentially broached to a size 0, size 0 stem with standard neck and S head trials were placed and the final components chosen. The 0 standard stem was inserted along with a metal S 28 mm head and 54 mm liner. The hip was reduced and was stable the wound was thoroughly irrigated with fibrillar placed along the posterior capsule and medial neck. The deep fascia ws closed using a heavy Quill after infiltration of 30 cc of quarter percent Sensorcaine with epinephrine along with Exparel throughout the case .3-0 V-loc to close the skin with skin staples.  Incisional wound VAC applied and patient was sent to recovery in stable condition.   PLAN OF CARE: Admit for overnight observation

## 2021-03-03 NOTE — H&P (Signed)
Chief Complaint  Patient presents with   Pre-op Exam  H&P right THA scheduled 03/03/21    History of the Present Illness: Bruce Moody is a 70 y.o. male here today for history and physical for right total hip arthroplasty with Dr. Hessie Knows on 03/03/2021. Patient has severe right hip osteoarthritis. Pain has been progressing over the last 3 years. He describes lateral hip, groin and thigh pain worse with weightbearing activity. He begins limping and needs to sit down after walking 100 yards. He has a hard time going to the grocery stores and performing activities of daily living. He has been taking Tylenol and ibuprofen with little relief. His pain will awaken him at night. Denies any numbness tingling radicular symptoms.  Patient has had a successful left total hip arthroplasty.  The patient is retired. He lives with his wife.  I have reviewed past medical, surgical, social and family history, and allergies as documented in the EMR.  Past Medical History: Past Medical History:  Diagnosis Date   CAD (coronary artery disease)   Hyperlipidemia   Hypertension   LV dysfunction   Myocardial infarction (CMS-HCC)   VHD (valvular heart disease)   Past Surgical History: Past Surgical History:  Procedure Laterality Date   ARTHROSCOPIC ROTATOR CUFF REPAIR   Cardiac cath   CORONARY ARTERY BYPASS GRAFT   HERNIA REPAIR   PCI and stent placement left sided PDA   Total hip arthroplasty anterior approach Left 06/08/2016  Dr.Jaydeen Darley   Past Family History: Family History  Problem Relation Age of Onset   Myocardial Infarction (Heart attack) Mother   Lymphoma Father   Alzheimer's disease Other   Medications: Current Outpatient Medications Ordered in Epic  Medication Sig Dispense Refill   acetaminophen (TYLENOL) 500 MG tablet Take 1,000 mg by mouth once daily as needed   aspirin 81 MG EC tablet Take 81 mg by mouth once daily.   atorvastatin (LIPITOR) 80 MG tablet Take 1 tablet (80 mg  total) by mouth once daily 90 tablet 3   carvediloL (COREG) 3.125 MG tablet TAKE 1 TABLET TWICE A DAY 180 tablet 1   clopidogreL (PLAVIX) 75 mg tablet Take 1 tablet (75 mg total) by mouth once daily 90 tablet 3   ibuprofen (ADVIL,MOTRIN) 200 MG tablet Take 1 tablet by mouth every 8 (eight) hours as needed.   ramipriL (ALTACE) 2.5 MG capsule TAKE 1 CAPSULE ONCE DAILY 90 capsule 1   sildenafil, antihypertensive, (REVATIO) 20 mg tablet Take 3-5 tablets by mouth as needed   No current Epic-ordered facility-administered medications on file.   Allergies: No Known Allergies   Body mass index is 23.56 kg/m.  Review of Systems: A comprehensive 14 point ROS was performed, reviewed, and the pertinent orthopaedic findings are documented in the HPI.  Vitals:  02/25/21 1438  BP: 120/82    General Physical Examination:  General:  Well developed, well nourished, no apparent distress, normal affect, antalgic gait with no assistive devices.  HEENT: Head normocephalic, atraumatic, PERRL.   Abdomen: Soft, non tender, non distended, Bowel sounds present.  Heart: Examination of the heart reveals regular, rate, and rhythm. There is no murmur noted on ascultation. There is a normal apical pulse.  Lungs: Lungs are clear to auscultation. There is no wheeze, rhonchi, or crackles. There is normal expansion of bilateral chest walls.   Musculoskeletal Examination:  On exam, right hip has -10 degrees internal rotation and 30 degrees external. No flexion contracture. Antalgic gait with no assistive device. No  swelling warmth erythema or edema throughout the right lower extremity.  Radiographs:  AP pelvis and lateral x-rays of the right hip are reviewed by me today. These show prior left total hip arthroplasty. Right hip has complete loss of superior joint space, some subluxation, slight bone loss. No collapse of the femoral head. There are extensive osteophytes to the inferomedial acetabulum on AP and  lateral imaging, extensive femoral head osteophytes.  X-ray Impression Severe right hip osteoarthritis.  Assessment: ICD-10-CM  1. Primary osteoarthritis of right hip M16.11   Plan:  36. 70 year old male with severe right hip osteoarthritis. Pain has been progressing over the last few years. Pain interferes with quality of life and activities daily living. Risks, benefits, complications of a right total hip arthroplasty have been discussed with the patient. Patient has agreed and consented procedure with Dr. Hessie Knows on 03/03/2021  Electronically signed by Feliberto Gottron, PA at 02/25/2021 2:55 PM EDT  Reviewed  H+P. No changes noted.

## 2021-03-03 NOTE — Evaluation (Signed)
Occupational Therapy Evaluation Patient Details Name: Bruce Moody MRN: [id number] DOB: 10/06/1950 Today's Date: 03/03/2021    History of Present Illness Bruce Moody is a 70 y.o. male here today for history and physical for right total hip arthroplasty with Dr. Hessie Knows on 03/03/2021. PMH includes: CAD, HLD, HTN, MI, VHD, and hx L THA (2017).   Clinical Impression   Pt seen for OT evaluation this date, POD#0 from above surgery. Pt seated in recliner after recent PT assessment and family present. Pt was independent in all ADL prior to surgery and is eager to return to PLOF with less pain and improved safety and independence. Pt currently requires PRN MIN A for LB dressing and bathing while in seated position due to pain and limited AROM of R hip. Pt/spouse instructed in self care skills, falls prevention strategies, home/routines modifications, DME/AE for LB bathing and dressing tasks, pet care considerations, and compression stocking mgt strategies. Handout provided to support recall and carryover. Pt would benefit from additional instruction in self care skills and techniques to help maintain precautions with or without assistive devices to support recall and carryover prior to discharge. Do not anticipate skilled OT needs upon discharge. Pt/family in agreement.      Follow Up Recommendations  No OT follow up    Equipment Recommendations  3 in 1 bedside commode    Recommendations for Other Services       Precautions / Restrictions Precautions Precautions: Fall;Anterior Hip Precaution Booklet Issued: Yes (comment) Restrictions Weight Bearing Restrictions: Yes RLE Weight Bearing: Weight bearing as tolerated      Mobility Bed Mobility               General bed mobility comments: NT up in recliner    Transfers                 General transfer comment: deferred 2/2 pain, pt just received meds    Balance Overall balance assessment: Needs  assistance Sitting-balance support: No upper extremity supported;Feet supported Sitting balance-Leahy Scale: Good                                     ADL either performed or assessed with clinical judgement   ADL Overall ADL's : Needs assistance/impaired                                       General ADL Comments: Pt currently requires MIN A for LB bathing and dressing, MOD-MAX A for compression stockings mgt. Family present and endorse able to assist.     Vision Patient Visual Report: No change from baseline       Perception     Praxis      Pertinent Vitals/Pain Pain Assessment: 0-10 Pain Score: 4  Pain Location: R hip Pain Descriptors / Indicators: Aching Pain Intervention(s): Limited activity within patient's tolerance;Monitored during session;Premedicated before session     Hand Dominance Right   Extremity/Trunk Assessment Upper Extremity Assessment Upper Extremity Assessment: Overall WFL for tasks assessed   Lower Extremity Assessment Lower Extremity Assessment: RLE deficits/detail RLE Deficits / Details: expected post-op strength/ROM deficits   Cervical / Trunk Assessment Cervical / Trunk Assessment: Normal   Communication Communication Communication: No difficulties   Cognition Arousal/Alertness: Awake/alert Behavior During Therapy: WFL for tasks assessed/performed Overall Cognitive Status: Within Functional  Limits for tasks assessed                                     General Comments       Exercises Other Exercises Other Exercises: Pt/family instructed in home/routines modifications, compression stocking mgt, falls prevention, pet care considerations, and AE/DME for ADL tasks. Handout provided to support recall and carryover.   Shoulder Instructions      Home Living Family/patient expects to be discharged to:: Private residence Living Arrangements: Spouse/significant other;Children Available Help at  Discharge: Family;Available 24 hours/day Type of Home: House Home Access: Stairs to enter [employer] of Steps: 2 w/ bilat rails, 1 step no rails Entrance Stairs-Rails: Right;Left;Can reach both Home Layout: One level     Bathroom Shower/Tub: Teacher, early years/pre: Standard     Home Equipment: Environmental consultant - 2 wheels;Cane - single point          Prior Functioning/Environment Level of Independence: Independent        Comments: Pt independent, no falls.        OT Problem List: Decreased strength;Decreased range of motion;Pain;Impaired balance (sitting and/or standing);Decreased knowledge of use of DME or AE      OT Treatment/Interventions: Self-care/ADL training;Therapeutic exercise;Therapeutic activities;DME and/or AE instruction;Patient/family education;Balance training    OT Goals(Current goals can be found in the care plan section) Acute Rehab OT Goals Patient Stated Goal: go home OT Goal Formulation: With patient/family Time For Goal Achievement: 03/17/21 Potential to Achieve Goals: Good ADL Goals Pt Will Perform Lower Body Dressing: with modified independence;with adaptive equipment;sit to/from stand Pt Will Transfer to Toilet: with modified independence;ambulating (elevated commode, LRAD PRN) Additional ADL Goal #1: Pt will independently instruct family in compression stocking mgt  OT Frequency: Min 1X/week   Barriers to D/C:            Co-evaluation              AM-PAC OT "6 Clicks" Daily Activity     Outcome Measure Help from another person eating meals?: None Help from another person taking care of personal grooming?: None Help from another person toileting, which includes using toliet, bedpan, or urinal?: A Little Help from another person bathing (including washing, rinsing, drying)?: A Little Help from another person to put on and taking off regular upper body clothing?: None Help from another person to put on and taking off  regular lower body clothing?: A Little 6 Click Score: 21   End of Session    Activity Tolerance: Patient tolerated treatment well Patient left: in chair;with call bell/phone within reach;with chair alarm set;with family/visitor present;with SCD's reapplied;Other (comment) (woundvac in place)  OT Visit Diagnosis: Other abnormalities of gait and mobility (R26.89);Pain Pain - Right/Left: Right Pain - part of body: Hip                Time: [id number] OT Time Calculation (min): 14 min Charges:  OT General Charges $OT Visit: 1 Visit OT Evaluation $OT Eval Low Complexity: 1 Low OT Treatments $Self Care/Home Management : 8-22 mins  Hanley Hays, MPH, MS, OTR/L ascom [phone] 03/03/21, 3:49 PM

## 2021-03-03 NOTE — Progress Notes (Signed)
Patient's BP 93/66, HR 77. Patient has 100cc/hr of NS running, Coreg held. Patient asymptomatic.  Notified Emmit Alexanders, [place]. He states to hold Ramipril as well.

## 2021-03-03 NOTE — Evaluation (Signed)
Physical Therapy Evaluation Patient Details Name: Bruce Moody MRN: [id number] DOB: [birth year] Today's Date: 03/03/2021   History of Present Illness  Bruce Moody is a 70 y.o. male here today for history and physical for right total hip arthroplasty with Dr. Hessie Knows on 03/03/2021. PMH includes: CAD, HLD, HTN, MI, VHD, and hx L THA (2017).   Clinical Impression  Pt admitted with above diagnosis. Pt received with spouse in room. Agreeable to PT services. Reports grogginess and pain in R hip at 4-5/10 NPS. Pt able to articulate with accuracy per spouse, PLOF, DME, home lay out. Full sensation to LT in RLE with no apparent deficits with ability to perform SLR, ankle DF/PF. Pt supervision required throughout session for bed mobility and SPT to recliner. Use of HOB elevated for bed mobility and RW for transferring to recliner. With standing x5 standing marches performed with limited foot clearance with decreased stance time on RLE due to pain. Able to laterally weight shift R/L with no R knee buckling. Min VC's required for safe hand placement and sequencing of RW during transfer and sequencing LE's throughout. Pt moving cautiously with minimal foot clearance with attempts to pivot into recliner due to RLE pain. Once seated pt educated on LE AROM exercises pt can perform prior to PT intervention tomorrow. Overall pt moving well being POD 0 and appears stable with use of RW with no balance concerns. Anticipate pt safe to D/c home with [facility] PT and assist/supervision from spouse if needed.      Follow Up Recommendations Home health PT;Follow surgeon's recommendation for DC plan and follow-up therapies    Equipment Recommendations  None recommended by PT    Recommendations for Other Services       Precautions / Restrictions Precautions Precautions: Fall;Anterior Hip Precaution Booklet Issued: No Restrictions Weight Bearing Restrictions: Yes RLE Weight Bearing: Weight bearing as tolerated       Mobility  Bed Mobility Overal bed mobility: Needs Assistance Bed Mobility: Supine to Sit     Supine to sit: HOB elevated;Supervision     General bed mobility comments: NT up in recliner    Transfers Overall transfer level: Needs assistance Equipment used: Rolling walker (2 wheeled) Transfers: Stand Pivot Transfers   Stand pivot transfers: Supervision;From elevated surface       General transfer comment: t/f from bed to chair with RW  Ambulation/Gait             General Gait Details: deferred due to POD 0  Stairs            Wheelchair Mobility    Modified Rankin (Stroke Patients Only)       Balance Overall balance assessment: Needs assistance Sitting-balance support: No upper extremity supported;Feet supported Sitting balance-Leahy Scale: Good       Standing balance-Leahy Scale: Fair Standing balance comment: Barely relies on BUE support on RW to maintain balance. Light touch utilized more so due to RLE surgical site pain.                             Pertinent Vitals/Pain Pain Assessment: 0-10 Pain Score: 5  Pain Location: R hip Pain Descriptors / Indicators: Aching Pain Intervention(s): Limited activity within patient's tolerance;Monitored during session;Repositioned    Home Living Family/patient expects to be discharged to:: Private residence Living Arrangements: Spouse/significant other;Children Available Help at Discharge: Family;Available 24 hours/day Type of Home: House Home Access: Stairs to enter Entrance Stairs-Rails: Right;Left;Can  reach both Entrance Stairs-Number of Steps: 2 w/ bilat rails, 1 step no rails Home Layout: One level Home Equipment: Walker - 2 wheels;Cane - single point      Prior Function Level of Independence: Independent         Comments: Pt independent, no falls.     Hand Dominance   Dominant Hand: Right    Extremity/Trunk Assessment   Upper Extremity Assessment Upper Extremity  Assessment: Overall WFL for tasks assessed    Lower Extremity Assessment Lower Extremity Assessment: RLE deficits/detail RLE Deficits / Details: expected post-op strength/ROM deficits RLE Sensation: WNL    Cervical / Trunk Assessment Cervical / Trunk Assessment: Normal  Communication   Communication: No difficulties  Cognition Arousal/Alertness: Awake/alert Behavior During Therapy: WFL for tasks assessed/performed Overall Cognitive Status: Within Functional Limits for tasks assessed                                 General Comments: Pt reports groggy from anesthesia. Answers questions appropriately.      General Comments      Exercises Other Exercises Other Exercises: Pt educated on ankle pumps, glute squeezes, hip abd/add, LAQ's.   Assessment/Plan    PT Assessment Patient needs continued PT services  PT Problem List Decreased strength;Decreased range of motion;Pain       PT Treatment Interventions DME instruction;Therapeutic exercise;Gait training;Balance training;Stair training;Neuromuscular re-education;Functional mobility training;Therapeutic activities;Patient/family education    PT Goals (Current goals can be found in the Care Plan section)  Acute Rehab PT Goals Patient Stated Goal: go home PT Goal Formulation: With patient/family Time For Goal Achievement: 03/10/21 Potential to Achieve Goals: Good    Frequency BID   Barriers to discharge        Co-evaluation               AM-PAC PT "6 Clicks" Mobility  Outcome Measure Help needed turning from your back to your side while in a flat bed without using bedrails?: A Little Help needed moving from lying on your back to sitting on the side of a flat bed without using bedrails?: A Little Help needed moving to and from a bed to a chair (including a wheelchair)?: A Little Help needed standing up from a chair using your arms (e.g., wheelchair or bedside chair)?: A Little Help needed to walk in  hospital room?: A Little Help needed climbing 3-5 steps with a railing? : A Little 6 Click Score: 18    End of Session Equipment Utilized During Treatment: Gait belt Activity Tolerance: Patient tolerated treatment well Patient left: in chair;with call bell/phone within reach;with nursing/sitter in room;with SCD's reapplied Nurse Communication: Mobility status PT Visit Diagnosis: Other abnormalities of gait and mobility (R26.89);Muscle weakness (generalized) (M62.81);Difficulty in walking, not elsewhere classified (R26.2)    Time: [id number] PT Time Calculation (min) (ACUTE ONLY): 28 min   Charges:   PT Evaluation $PT Eval Low Complexity: 1 Low PT Treatments $Gait Training: 8-22 mins $Therapeutic Activity: 8-22 mins        Maryruth Apple M. Fairly IV, PT, DPT Physical Therapist- [facility]  03/03/2021, 4:07 PM

## 2021-03-03 NOTE — Progress Notes (Signed)
Patient resting in bed comforably. VSS. No complaint of pain at this time.   03/03/21 1307  Vitals  Temp 98.6 F (37 C)  BP 125/72  MAP (mmHg) 87  BP Location Right Arm  BP Method Automatic  Patient Position (if appropriate) Lying  Pulse Rate 67  Resp 15  MEWS COLOR  MEWS Score Color Green  Oxygen Therapy  SpO2 99 %  O2 Device Room [employer]

## 2021-03-03 NOTE — Anesthesia Postprocedure Evaluation (Signed)
Anesthesia Post Note  Patient: Bruce Moody  Procedure(s) Performed: TOTAL HIP ARTHROPLASTY ANTERIOR APPROACH (Right: Hip)  Patient location during evaluation: PACU Anesthesia Type: General Level of consciousness: awake and alert Pain management: pain level controlled Vital Signs Assessment: post-procedure vital signs reviewed and stable Respiratory status: spontaneous breathing, nonlabored ventilation, respiratory function stable and patient connected to nasal cannula oxygen Cardiovascular status: blood pressure returned to baseline and stable Postop Assessment: no apparent nausea or vomiting Anesthetic complications: no   No notable events documented.   Last Vitals:  Vitals:   03/03/21 1243 03/03/21 1307  BP: 114/67 125/72  Pulse: 60 67  Resp: 14 15  Temp:  37 C  SpO2: 100% 99%    Last Pain:  Vitals:   03/03/21 1243  TempSrc:   PainSc: 3                  Arita Miss

## 2021-03-03 NOTE — Anesthesia Procedure Notes (Signed)
Procedure Name: Intubation Date/Time: 03/03/2021 9:50 AM Performed by: Arita Miss, MD Pre-anesthesia Checklist: Patient identified, Patient being monitored, Timeout performed, Emergency Drugs available and Suction available Patient Re-evaluated:Patient Re-evaluated prior to induction Oxygen Delivery Method: Circle system utilized Preoxygenation: Pre-oxygenation with 100% oxygen Induction Type: IV induction Ventilation: Mask ventilation without difficulty Laryngoscope Size: McGraph and 3 Grade View: Grade III Tube type: Oral Tube size: 7.0 mm Number of attempts: 2 Airway Equipment and Method: Stylet, Bougie stylet and Video-laryngoscopy Placement Confirmation: ETT inserted through vocal cords under direct vision, positive ETCO2 and breath sounds checked- equal and bilateral Secured at: 23 cm Tube secured with: Tape Dental Injury: Teeth and Oropharynx as per pre-operative assessment  Difficulty Due To: Difficulty was unanticipated and Difficult Airway- due to anterior larynx Future Recommendations: Recommend- induction with short-acting agent, and alternative techniques readily available Comments: First view with Mac 3 resulted in Grade 3 view. Then Mcgrath 3 blade resulted in similar view, perhaps a 2b view with strong cricoid pressure. Attempt to pass ETT with regular stylet unsuccessful as it kept getting caught on arytenoids. Then used a driveable bougie with easy access past cords, but with difficulty in passing ETT over the bougie past the arytenoids. Ultimately successful with cricoid manipulation. Easy MV between attempts.

## 2021-03-03 NOTE — Transfer of Care (Signed)
Immediate Anesthesia Transfer of Care Note  Patient: Bruce Moody  Procedure(s) Performed: TOTAL HIP ARTHROPLASTY ANTERIOR APPROACH (Right: Hip)  Patient Location: PACU  Anesthesia Type:General  Level of Consciousness: awake and alert   Airway & Oxygen Therapy: Patient Spontanous Breathing and Patient connected to face mask oxygen  Post-op Assessment: Report given to RN and Post -op Vital signs reviewed and stable  Post vital signs: Reviewed and stable  Last Vitals:  Vitals Value Taken Time  BP 148/85 03/03/21 1124  Temp    Pulse 61 03/03/21 1128  Resp 20 03/03/21 1128  SpO2 100 % 03/03/21 1128  Vitals shown include unvalidated device data.  Last Pain:  Vitals:   03/03/21 0900  TempSrc: Temporal  PainSc: 2          Complications: No notable events documented.

## 2021-03-04 ENCOUNTER — Encounter: Payer: Self-pay | Admitting: Orthopedic Surgery

## 2021-03-04 DIAGNOSIS — G8929 Other chronic pain: Secondary | ICD-10-CM | POA: Diagnosis not present

## 2021-03-04 DIAGNOSIS — M1611 Unilateral primary osteoarthritis, right hip: Secondary | ICD-10-CM | POA: Diagnosis not present

## 2021-03-04 LAB — BASIC METABOLIC PANEL
Anion gap: 4 — ABNORMAL LOW (ref 5–15)
BUN: 15 mg/dL (ref 8–23)
CO2: 24 mmol/L (ref 22–32)
Calcium: 7.3 mg/dL — ABNORMAL LOW (ref 8.9–10.3)
Chloride: 104 mmol/L (ref 98–111)
Creatinine, Ser: 0.81 mg/dL (ref 0.61–1.24)
GFR, Estimated: >60 mL/min (ref >60)
Glucose, Bld: 160 mg/dL — ABNORMAL HIGH (ref 70–99)
Potassium: 3.9 mmol/L (ref 3.5–5.1)
Sodium: 132 mmol/L — ABNORMAL LOW (ref 135–145)

## 2021-03-04 LAB — CBC
HCT: 23.6 % — ABNORMAL LOW (ref 39.0–52.0)
Hemoglobin: 7.8 g/dL — ABNORMAL LOW (ref 13.0–17.0)
MCH: 33.2 pg (ref 26.0–34.0)
MCHC: 33.1 g/dL (ref 30.0–36.0)
MCV: 100.4 fL — ABNORMAL HIGH (ref 80.0–100.0)
Platelets: 206 10*3/uL (ref 150–400)
RBC: 2.35 MIL/uL — ABNORMAL LOW (ref 4.22–5.81)
RDW: 12.6 % (ref 11.5–15.5)
WBC: 10.5 10*3/uL (ref 4.0–10.5)
nRBC: 0 % (ref 0.0–0.2)

## 2021-03-04 MED ORDER — TRAMADOL HCL 50 MG PO TABS: 50.0000 mg | ORAL_TABLET | Freq: Four times a day (QID) | ORAL | 0 refills | Status: DC | PRN

## 2021-03-04 MED ORDER — DOCUSATE SODIUM 100 MG PO CAPS
100.0000 mg | ORAL_CAPSULE | Freq: Two times a day (BID) | ORAL | 0 refills | Status: DC
Start: 2021-03-04 — End: 2021-11-06

## 2021-03-04 MED ORDER — FE FUMARATE-B12-VIT C-FA-IFC PO CAPS
1.0000 | ORAL_CAPSULE | Freq: Two times a day (BID) | ORAL | Status: DC
  Administered 2021-03-04: 1 via ORAL
  Filled 2021-03-04 (×2): qty 1

## 2021-03-04 MED ORDER — FE FUMARATE-B12-VIT C-FA-IFC PO CAPS: 1.0000 | ORAL_CAPSULE | Freq: Two times a day (BID) | ORAL | 0 refills | Status: DC

## 2021-03-04 MED ORDER — METHOCARBAMOL 500 MG PO TABS: 500.0000 mg | ORAL_TABLET | Freq: Four times a day (QID) | ORAL | 0 refills | Status: DC | PRN

## 2021-03-04 MED ORDER — HEMOSTATIC AGENTS (NO CHARGE) OPTIME
TOPICAL | Status: DC | PRN
  Administered 2021-03-03: 2 via TOPICAL

## 2021-03-04 MED ORDER — HYDROCODONE-ACETAMINOPHEN 5-325 MG PO TABS: 1.0000 | ORAL_TABLET | ORAL | 0 refills | Status: DC | PRN

## 2021-03-04 NOTE — Progress Notes (Signed)
   Subjective: 1 Day Post-Op Procedure(s) (LRB): TOTAL HIP ARTHROPLASTY ANTERIOR APPROACH (Right) Patient reports pain as 2 on 0-10 scale.   Patient is well, and has had no acute complaints or problems Denies any CP, SOB, ABD pain. We will continue therapy today.  Plan is to go Home after hospital stay.  Objective: Vital signs in last 24 hours: Temp:  [97.2 F (36.2 C)-99.5 F (37.5 C)] 98.5 F (36.9 C) (08/17 0804) Pulse Rate:  [56-93] 93 (08/17 0804) Resp:  [12-20] 18 (08/17 0804) BP: (93-148)/(53-85) 101/53 (08/17 0804) SpO2:  [93 %-100 %] 97 % (08/17 0804) Weight:  [90.2 kg] 90.2 kg (08/16 1428)  Intake/Output from previous day: 08/16 0701 - 08/17 0700 In: [place] [P.O.:240; I.V.:1500; IV Piggyback:100] Out: 500 [Urine:300; Blood:200] Intake/Output this shift: No intake/output data recorded.  Recent Labs    03/04/21 0326  HGB 7.8*   Recent Labs    03/04/21 0326  WBC 10.5  RBC 2.35*  HCT 23.6*  PLT 206   Recent Labs    03/04/21 0326  NA 132*  K 3.9  CL 104  CO2 24  BUN 15  CREATININE 0.81  GLUCOSE 160*  CALCIUM 7.3*   No results for input(s): LABPT, INR in the last 72 hours.  EXAM General - Patient is Alert, Appropriate, and Oriented Extremity - Neurovascular intact Sensation intact distally Intact pulses distally Dorsiflexion/Plantar flexion intact No cellulitis present Compartment soft Dressing - dressing C/D/I and no drainage, prevena intact with out drainage Motor Function - intact, moving foot and toes well on exam.   Past Medical History:  Diagnosis Date   Arthritis    right hip   Cancer ([place]) 2011   Melanoma pn forehead 2017 and upper back 2011   Coronary artery disease    Elevated lipids    Heart disease    History of kidney stones    Hyperlipidemia    Hypertension    Myocardial infarction ([place]) 1998    Assessment/Plan:   1 Day Post-Op Procedure(s) (LRB): TOTAL HIP ARTHROPLASTY ANTERIOR APPROACH (Right) Active Problems:    Status post total hip replacement, right  Estimated body mass index is 35.23 kg/m as calculated from the following:   Height as of this encounter: '5\' 3"'$  (1.6 m).   Weight as of this encounter: 90.2 kg. Advance diet Up with therapy Work on BM Pain well controlled Acute post op blood loss anemia - Hgb 7.8, start iron supplement. Recheck labs in the am VSS CM to assist with discharge  DVT Prophylaxis - Aspirin, TED hose, and SCDs, plavix Weight-Bearing as tolerated to right leg   T. Rachelle Hora, PA-C [place] 03/04/2021, 8:10 AM

## 2021-03-04 NOTE — Progress Notes (Signed)
Occupational Therapy Treatment Patient Details Name: Bruce Moody MRN: [id number] DOB: 1950-08-18 Today's Date: 03/04/2021    History of present illness Bruce Moody is a 70 y.o. male here today for history and physical for right total hip arthroplasty with Dr. Hessie Knows on 03/03/2021. PMH includes: CAD, HLD, HTN, MI, VHD, and hx L THA (2017).   OT comments  Pt seen for OT tx this date, POD#1 from above surgery. Upon arrival to room, pt seated upright in recliner, reporting 2/10 pain, with spouse present. Pt agreeable to OT tx. Pt A&Ox4, however stated that he did not recall OT session yesterday. Pt re-educated on falls prevention strategies, home/routines modifications, DME/AE for LB bathing and dressing tasks, and compression stocking mgt. Pt demonstrated good understanding of education provided and was able to perform seated LB dressing with AD (reacher and sock aide) requiring SUPERVISION/SET-UP only. Additionally, pt was able to perform toilet transfer, standing grooming tasks, and functional mobility of short household distances with RW and SUPERVISION only. Pt would benefit from additional skilled OT services including additional instruction in techniques, with or without assistive devices, for dressing and bathing skills to support recall and carryover prior to discharge and ultimately to maximize safety, independence, and minimize falls risk and caregiver burden. Do not currently anticipate any OT needs following this hospitalization.      Follow Up Recommendations  No OT follow up    Equipment Recommendations  3 in 1 bedside commode       Precautions / Restrictions Precautions Precautions: Fall;Anterior Hip Precaution Booklet Issued: No Restrictions Weight Bearing Restrictions: Yes RLE Weight Bearing: Weight bearing as tolerated       Mobility Bed Mobility Overal bed mobility: Modified Independent Bed Mobility: Supine to Sit     Supine to sit: Supervision      General bed mobility comments: not assessed, pt in recliner at beginning/end of session    Transfers Overall transfer level: Needs assistance Equipment used: Rolling walker (2 wheeled) Transfers: Sit to/from Stand Sit to Stand: Supervision         General transfer comment: Able to rise to standing safely and confidently w/o assist. Safe hand placement    Balance Overall balance assessment: Needs assistance Sitting-balance support: No upper extremity supported;Feet supported Sitting balance-Leahy Scale: Good Sitting balance - Comments: good sitting balance in recliner during LB dressing with AD   Standing balance support: No upper extremity supported;During functional activity Standing balance-Leahy Scale: Good Standing balance comment: Good standing balance during grooming & hygiene tasks with b/l UE unsupported                           ADL either performed or assessed with clinical judgement   ADL       Grooming: Wash/dry hands;Wash/dry face;Oral care;Supervision/safety;Set up;Standing               Lower Body Dressing: Supervision/safety;Set up;Sitting/lateral leans Lower Body Dressing Details (indicate cue type and reason): to don/doff socks with reacher and sock aide Toilet Transfer: Supervision/safety;Set up;BSC;Ambulation;RW           Functional mobility during ADLs: Supervision/safety;Rolling walker        Cognition Arousal/Alertness: Awake/alert Behavior During Therapy: WFL for tasks assessed/performed Overall Cognitive Status: Within Functional Limits for tasks assessed  Exercises Exercises: Total Joint Total Joint Exercises Quad Sets: Strengthening;10 reps Short Arc Quad: Strengthening;10 reps Heel Slides: Strengthening;10 reps (with resisted leg ext) Hip ABduction/ADduction: Strengthening;10 reps Straight Leg Raises: AAROM;5 reps (unable to AROM lift LE)            Pertinent Vitals/ Pain       Pain Assessment: 0-10 Pain Score: 2  Pain Location: R hip Pain Descriptors / Indicators: Aching Pain Intervention(s): Limited activity within patient's tolerance;Monitored during session;Repositioned         Frequency  Min 1X/week        Progress Toward Goals  OT Goals(current goals can now be found in the care plan section)  Progress towards OT goals: Progressing toward goals  Acute Rehab OT Goals Patient Stated Goal: go home OT Goal Formulation: With patient/family Time For Goal Achievement: 03/17/21 Potential to Achieve Goals: Good  Plan Discharge plan remains appropriate;Frequency remains appropriate       AM-PAC OT "6 Clicks" Daily Activity     Outcome Measure   Help from another person eating meals?: None Help from another person taking care of personal grooming?: None Help from another person toileting, which includes using toliet, bedpan, or urinal?: A Little Help from another person bathing (including washing, rinsing, drying)?: A Little Help from another person to put on and taking off regular upper body clothing?: None Help from another person to put on and taking off regular lower body clothing?: A Little 6 Click Score: 21    End of Session Equipment Utilized During Treatment: Gait belt;Rolling walker  OT Visit Diagnosis: Other abnormalities of gait and mobility (R26.89);Pain Pain - Right/Left: Right Pain - part of body: Hip   Activity Tolerance Patient tolerated treatment well   Patient Left in chair;with call bell/phone within reach;with chair alarm set;with family/visitor present   Nurse Communication Mobility status        Time: 1005-1030 OT Time Calculation (min): 25 min  Charges: OT General Charges $OT Visit: 1 Visit OT Treatments $Self Care/Home Management : 23-37 mins   Fredirick Maudlin, OTR/L [place]

## 2021-03-04 NOTE — TOC Transition Note (Signed)
Transition of Care [facility]) - CM/SW Discharge Note   Patient Details  Name: Bruce Moody MRN: [id number] Date of Birth: Aug 10, 1950  [facility] of [facility]) CM/SW Contact:  Candie Chroman, LCSW Phone Number: 03/04/2021, 10:35 AM   Clinical Narrative:   Patient has orders to discharge home today. Patient was set up with [facility] prior to admission. No DME recommendations from PT. OT recommending 3-in-1. Patient is agreeable to [place] and 3-in-1. Ordered 3-in-1 through [employer]. No further concerns. CSW signing off.   Final next level of care: [place] Barriers to Discharge: No Barriers Identified   Patient Goals and CMS Choice     Choice offered to / list presented to : Patient  Discharge Placement                    Patient and family notified of of transfer: 03/04/21  Discharge Plan and Services                DME Arranged: 3-N-1 DME Agency: [employer] Date DME Agency Contacted: 03/04/21   Representative spoke with at DME Agency: [place]: PT [place]: [place] Date [place]: 03/04/21   Representative spoke with at [place]: [place] [facility]  Social Determinants of Health ([place]) Interventions     Readmission Risk Interventions No flowsheet data found.

## 2021-03-04 NOTE — Discharge Instructions (Signed)

## 2021-03-04 NOTE — Progress Notes (Signed)
Physical Therapy Treatment Patient Details Name: Bruce Moody MRN: [id number] DOB: March 12, 1951 Today's Date: 03/04/2021    History of Present Illness Bruce Moody is a 70 y.o. male here today for history and physical for right total hip arthroplasty with Dr. Hessie Knows on 03/03/2021. PMH includes: CAD, HLD, HTN, MI, VHD, and hx L THA (2017).    PT Comments    Pt did very well with PT session today, was able to do bed mobility, transfers, prolonged bout of ambulation and stair negotiation all w/o direct assist and needing only minimal explanation/cuing.  He did have some expected pain but was not limited by it.  Pt showed good effort and confidence and showed ability to safely d/c home with assist from wife.     Follow Up Recommendations  Follow surgeon's recommendation for DC plan and follow-up therapies     Equipment Recommendations  None recommended by PT    Recommendations for Other Services       Precautions / Restrictions Precautions Precautions: Fall;Anterior Hip Restrictions RLE Weight Bearing: Weight bearing as tolerated    Mobility  Bed Mobility Overal bed mobility: Modified Independent Bed Mobility: Supine to Sit     Supine to sit: Supervision     General bed mobility comments: easily transitioned supine to EOB    Transfers Overall transfer level: Modified independent Equipment used: Rolling walker (2 wheeled) Transfers: Sit to/from Stand Sit to Stand: Supervision         General transfer comment: Able to rise to standing safely and confidently w/o assist  Ambulation/Gait Ambulation/Gait assistance: Supervision Gait Distance (Feet): 250 Feet Assistive device: Rolling walker (2 wheeled)       General Gait Details: Pt was able to ambulate confidently and safely.  After a few initial hesitant steps he was quickly able to assume consistent cadence with no apparent favoring of the surgical leg   Stairs Stairs: Yes Stairs assistance:  Supervision Stair Management: Two rails;Forwards Number of Stairs: 4 General stair comments: Pt was able to negotiate up/down steps (reciprocally) with good confidence and safety using b/l rails.  Additionally negotiated single step w/o rails using walker to simulate threshold into home.   Wheelchair Mobility    Modified Rankin (Stroke Patients Only)       Balance Overall balance assessment: Needs assistance Sitting-balance support: No upper extremity supported;Feet supported Sitting balance-Leahy Scale: Good     Standing balance support: Bilateral upper extremity supported Standing balance-Leahy Scale: Good                              Cognition Arousal/Alertness: Awake/alert Behavior During Therapy: WFL for tasks assessed/performed Overall Cognitive Status: Within Functional Limits for tasks assessed                                        Exercises Total Joint Exercises Quad Sets: Strengthening;10 reps Short Arc Quad: Strengthening;10 reps Heel Slides: Strengthening;10 reps (with resisted leg ext) Hip ABduction/ADduction: Strengthening;10 reps Straight Leg Raises: AAROM;5 reps (unable to AROM lift LE)    General Comments        Pertinent Vitals/Pain Pain Assessment: 0-10 Pain Score: 4     Home Living                      Prior Function  PT Goals (current goals can now be found in the care plan section) Progress towards PT goals: Progressing toward goals    Frequency    BID      PT Plan Current plan remains appropriate    Co-evaluation              AM-PAC PT "6 Clicks" Mobility   Outcome Measure  Help needed turning from your back to your side while in a flat bed without using bedrails?: None Help needed moving from lying on your back to sitting on the side of a flat bed without using bedrails?: None Help needed moving to and from a bed to a chair (including a wheelchair)?: None Help needed  standing up from a chair using your arms (e.g., wheelchair or bedside chair)?: A Little Help needed to walk in hospital room?: A Little Help needed climbing 3-5 steps with a railing? : A Little 6 Click Score: 21    End of Session Equipment Utilized During Treatment: Gait belt Activity Tolerance: Patient tolerated treatment well Patient left: in chair;with call bell/phone within reach;with nursing/sitter in room;with SCD's reapplied Nurse Communication: Mobility status PT Visit Diagnosis: Other abnormalities of gait and mobility (R26.89);Muscle weakness (generalized) (M62.81);Difficulty in walking, not elsewhere classified (R26.2)     Time: [id number] PT Time Calculation (min) (ACUTE ONLY): 30 min  Charges:  $Gait Training: 8-22 mins $Therapeutic Exercise: 8-22 mins                     Kreg Shropshire, DPT 03/04/2021, 10:58 AM

## 2021-03-04 NOTE — Plan of Care (Signed)
Patient discharged home per MD orders at this time.All discharge instructions,education and medications reviewed with patient and wife.Pt expressed understanding and will comply with dc instructions.follow appointments was also communicated to patient.no verbal c/o or any ssx of distress.Pt discharged home with HH/PT services per order.Pt was transported home by wife in a private car.

## 2021-03-04 NOTE — Discharge Summary (Signed)
Physician Discharge Summary  Patient ID: ANTAEUS Moody MRN: [id number] DOB/AGE: [birth year] 70 y.o.  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 male who was admitted 03/03/2021 with a diagnosis of right hip osteoarthritis and went to the operating room on 03/03/2021 and underwent the above named procedures.    Surgeries: Procedure(s): TOTAL HIP ARTHROPLASTY ANTERIOR APPROACH on 03/03/2021 Patient tolerated the surgery well. Taken to PACU where she was stabilized and then transferred to the orthopedic floor.  Started on aspirin and plavix. Foot pumps applied bilaterally at 80 mm. Heels elevated on bed with rolled towels. No evidence of DVT. Negative Homan. Physical therapy started on day #1 for gait training and transfer. OT started day #1 for ADL and assisted devices.  Patient's foley was d/c on  day #1. Patient's IV was d/c on day #2.  On post op day #1 patient was stable and ready for discharge to home with HHPT.  Implants: Medacta AMIS 0 standard stem with 54 mm Mpact DM cup and liner with metal S 28 mm head    He was given perioperative antibiotics:  Anti-infectives (From admission, onward)    Start     Dose/Rate Route Frequency Ordered Stop   03/03/21 1400  ceFAZolin (ANCEF) IVPB 1 g/50 mL premix        1 g 100 mL/hr over 30 Minutes Intravenous Every 6 hours 03/03/21 1134 03/03/21 2021   03/03/21 0852  ceFAZolin (ANCEF) 2-4 GM/100ML-% IVPB       Note to Pharmacy: Trudie Reed   : cabinet override      03/03/21 0852 03/03/21 1002   03/03/21 0600  ceFAZolin (ANCEF) IVPB 2g/100 mL premix        2 g 200 mL/hr over 30 Minutes Intravenous On call to O.R. 03/02/21 2237 03/03/21 [id number]     .  He was given sequential compression devices, early ambulation, and aspirin and plavix for DVT prophylaxis.  He benefited maximally from the hospital stay and there were no complications.    Recent vital signs:  Vitals:   03/04/21 0431 03/04/21 0804  BP: 114/61 (!) 101/53  Pulse: 87 93  Resp: 20 18  Temp: 99.5 F (37.5 C) 98.5 F (36.9 C)  SpO2: 93% 97%    Recent laboratory studies:  Lab Results  Component Value Date  HGB 7.8 (L) 03/04/2021   HGB 13.4 02/24/2021   HGB 13.6 04/21/2020   Lab Results  Component Value Date   WBC 10.5 03/04/2021   PLT 206 03/04/2021   Lab Results  Component Value Date   INR 0.9 02/24/2021   Lab Results  Component Value Date   NA 132 (L) 03/04/2021   K 3.9 03/04/2021   CL 104 03/04/2021   CO2 24 03/04/2021   BUN 15 03/04/2021   CREATININE 0.81 03/04/2021   GLUCOSE 160 (H) 03/04/2021    Discharge Medications:   Allergies as of 03/04/2021   No Known Allergies      Medication List     STOP taking these medications    ibuprofen 200 MG tablet Commonly known as: ADVIL       TAKE these medications    acetaminophen 500 MG  tablet Commonly known as: TYLENOL Take 500-1,000 mg by mouth every 6 (six) hours as needed for moderate pain.   amoxicillin 500 MG capsule Commonly known as: AMOXIL Take 500 mg by mouth 3 (three) times daily. Take 4 capsules by mouth 1 hour prior to dental procedures.   aspirin EC 81 MG tablet Take 81 mg by mouth daily.   atorvastatin 80 MG tablet Commonly known as: LIPITOR Take 1 tablet (80 mg total) by mouth daily.   carvedilol 3.125 MG tablet Commonly known as: COREG Take 3.125 mg by mouth 2 (two) times daily with a meal.   clopidogrel 75 MG tablet Commonly known as: PLAVIX Take 75 mg by mouth daily.   docusate sodium 100 MG capsule Commonly known as: COLACE Take 1 capsule (100 mg total) by mouth 2 (two) times daily.   ferrous [id number] C-folic acid capsule Commonly known as: TRINSICON / FOLTRIN Take 1 capsule by mouth 2 (two) times daily.   HYDROcodone-acetaminophen 5-325 MG tablet Commonly known as: NORCO/VICODIN Take 1-2 tablets by mouth every 4 (four) hours as needed for moderate pain (pain score 4-6).   methocarbamol 500 MG tablet Commonly known as: ROBAXIN Take 1 tablet (500 mg total) by mouth every 6 (six) hours as needed for muscle spasms.   ramipril 2.5 MG capsule Commonly known as: ALTACE Take 2.5 mg by mouth at bedtime.   traMADol 50 MG tablet Commonly known as: ULTRAM Take 1 tablet (50 mg total) by mouth every 6 (six) hours as needed.       ASK your doctor about these medications    PRESCRIPTION MEDICATION Apply 1 application topically 2 (two) times daily. FLUOROURACIL 5% + CALCIPOTRIENE 0.005% apply twice daily to forehead, nose, and left cheek for 4-5 days               Durable Medical Equipment  (From admission, onward)           Start     Ordered   03/03/21 1313  DME Walker rolling  Once       Question Answer Comment  Walker: With 5 Inch Wheels   Patient needs a walker to treat with the following condition Status  post total hip replacement, right      03/03/21 1312   03/03/21 1313  DME 3 n 1  Once        03/03/21 1312   03/03/21 1313  DME Bedside commode  Once       Question:  Patient needs a bedside commode to treat with the following condition  Answer:  Status post total hip replacement, right   03/03/21 1312  Diagnostic Studies: DG HIP OPERATIVE UNILAT W OR W/O PELVIS RIGHT  Result Date: 03/03/2021 CLINICAL DATA:  RIGHT hip replacement surgery EXAM: OPERATIVE RIGHT HIP (WITH PELVIS IF PERFORMED) 3 VIEWS TECHNIQUE: Fluoroscopic spot image(s) were submitted for interpretation post-operatively. COMPARISON:  None FLUOROSCOPY TIME:  0 minutes 24 seconds Dose: 6.2 mGy FINDINGS: Initial image demonstrates osteoarthritic changes of the RIGHT hip joint with joint space narrowing and spur formation. Subsequent images obtained during placement of a RIGHT hip prosthesis IMPRESSION: RIGHT hip replacement surgery for osteoarthritis RIGHT hip. Electronically Signed   By: Lavonia Dana M.D.   On: 03/03/2021 13:24   DG HIP UNILAT W OR W/O PELVIS 2-3 VIEWS RIGHT  Result Date: 03/03/2021 CLINICAL DATA:  Postop right hip EXAM: DG HIP (WITH OR WITHOUT PELVIS) 2-3V RIGHT COMPARISON:  None. FINDINGS: Interval postsurgical changes from right total hip arthroplasty. Arthroplasty components appear in their expected alignment. No periprosthetic fracture is identified. Expected postoperative changes within the overlying soft tissues. IMPRESSION: Expected postsurgical changes of right total hip arthroplasty. Electronically Signed   By: Yetta Glassman M.D.   On: 03/03/2021 12:17    Disposition:      Follow-up Information     Duanne Guess, PA-C Follow up in 2 week(s).   Specialties: Orthopedic Surgery, Emergency Medicine Contact information: [address] [place] [address] [phone]                  Signed: Feliberto Gottron 03/04/2021, 10:25 AM

## 2021-03-05 LAB — SURGICAL PATHOLOGY

## 2021-03-09 ENCOUNTER — Other Ambulatory Visit: Payer: Self-pay

## 2021-03-09 ENCOUNTER — Emergency Department
Admission: EM | Admit: 2021-03-09 | Discharge: 2021-03-09 | Disposition: A | Discharge status: Home / Self Care | Payer: Medicare Other | Attending: Emergency Medicine | Admitting: Emergency Medicine

## 2021-03-09 ENCOUNTER — Emergency Department: Payer: Medicare Other

## 2021-03-09 DIAGNOSIS — I38 Endocarditis, valve unspecified: Secondary | ICD-10-CM | POA: Diagnosis not present

## 2021-03-09 DIAGNOSIS — M25551 Pain in right hip: Secondary | ICD-10-CM | POA: Insufficient documentation

## 2021-03-09 DIAGNOSIS — Z7982 Long term (current) use of aspirin: Secondary | ICD-10-CM | POA: Diagnosis not present

## 2021-03-09 DIAGNOSIS — E786 Lipoprotein deficiency: Secondary | ICD-10-CM | POA: Diagnosis not present

## 2021-03-09 DIAGNOSIS — G8918 Other acute postprocedural pain: Secondary | ICD-10-CM | POA: Insufficient documentation

## 2021-03-09 DIAGNOSIS — N528 Other male erectile dysfunction: Secondary | ICD-10-CM | POA: Diagnosis not present

## 2021-03-09 DIAGNOSIS — Z471 Aftercare following joint replacement surgery: Secondary | ICD-10-CM | POA: Diagnosis not present

## 2021-03-09 DIAGNOSIS — Z951 Presence of aortocoronary bypass graft: Secondary | ICD-10-CM | POA: Diagnosis not present

## 2021-03-09 DIAGNOSIS — I2510 Atherosclerotic heart disease of native coronary artery without angina pectoris: Secondary | ICD-10-CM | POA: Diagnosis not present

## 2021-03-09 DIAGNOSIS — Z5321 Procedure and treatment not carried out due to patient leaving prior to being seen by health care provider: Secondary | ICD-10-CM | POA: Insufficient documentation

## 2021-03-09 DIAGNOSIS — E785 Hyperlipidemia, unspecified: Secondary | ICD-10-CM | POA: Diagnosis not present

## 2021-03-09 DIAGNOSIS — Z87442 Personal history of urinary calculi: Secondary | ICD-10-CM | POA: Diagnosis not present

## 2021-03-09 DIAGNOSIS — I252 Old myocardial infarction: Secondary | ICD-10-CM | POA: Diagnosis not present

## 2021-03-09 DIAGNOSIS — K5792 Diverticulitis of intestine, part unspecified, without perforation or abscess without bleeding: Secondary | ICD-10-CM | POA: Diagnosis not present

## 2021-03-09 DIAGNOSIS — Z85828 Personal history of other malignant neoplasm of skin: Secondary | ICD-10-CM | POA: Diagnosis not present

## 2021-03-09 DIAGNOSIS — Z7902 Long term (current) use of antithrombotics/antiplatelets: Secondary | ICD-10-CM | POA: Diagnosis not present

## 2021-03-09 DIAGNOSIS — Z96641 Presence of right artificial hip joint: Secondary | ICD-10-CM | POA: Diagnosis not present

## 2021-03-09 DIAGNOSIS — I119 Hypertensive heart disease without heart failure: Secondary | ICD-10-CM | POA: Diagnosis not present

## 2021-03-09 LAB — URINALYSIS, COMPLETE (UACMP) WITH MICROSCOPIC
Bacteria, UA: NONE SEEN
Bilirubin Urine: NEGATIVE
Glucose, UA: NEGATIVE mg/dL
Hgb urine dipstick: NEGATIVE
Ketones, ur: NEGATIVE mg/dL
Leukocytes,Ua: NEGATIVE
Nitrite: NEGATIVE
Protein, ur: NEGATIVE mg/dL
Specific Gravity, Urine: 1.02 (ref 1.005–1.030)
Squamous Epithelial / HPF: NONE SEEN (ref 0–5)
pH: 6 (ref 5.0–8.0)

## 2021-03-09 LAB — CBC WITH DIFFERENTIAL/PLATELET
Abs Immature Granulocytes: 0.13 10*3/uL — ABNORMAL HIGH (ref 0.00–0.07)
Basophils Absolute: 0.1 10*3/uL (ref 0.0–0.1)
Basophils Relative: 1 %
Eosinophils Absolute: 0.2 10*3/uL (ref 0.0–0.5)
Eosinophils Relative: 2 %
HCT: 21.8 % — ABNORMAL LOW (ref 39.0–52.0)
Hemoglobin: 7.2 g/dL — ABNORMAL LOW (ref 13.0–17.0)
Immature Granulocytes: 1 %
Lymphocytes Relative: 15 %
Lymphs Abs: 1.7 10*3/uL (ref 0.7–4.0)
MCH: 34.4 pg — ABNORMAL HIGH (ref 26.0–34.0)
MCHC: 33 g/dL (ref 30.0–36.0)
MCV: 104.3 fL — ABNORMAL HIGH (ref 80.0–100.0)
Monocytes Absolute: 1.3 10*3/uL — ABNORMAL HIGH (ref 0.1–1.0)
Monocytes Relative: 12 %
Neutro Abs: 7.6 10*3/uL (ref 1.7–7.7)
Neutrophils Relative %: 69 %
Platelets: 367 10*3/uL (ref 150–400)
RBC: 2.09 MIL/uL — ABNORMAL LOW (ref 4.22–5.81)
RDW: 14.7 % (ref 11.5–15.5)
WBC: 10.9 10*3/uL — ABNORMAL HIGH (ref 4.0–10.5)
nRBC: 0 % (ref 0.0–0.2)

## 2021-03-09 LAB — COMPREHENSIVE METABOLIC PANEL
ALT: 72 U/L — ABNORMAL HIGH (ref 0–44)
AST: 87 U/L — ABNORMAL HIGH (ref 15–41)
Albumin: 3.7 g/dL (ref 3.5–5.0)
Alkaline Phosphatase: 206 U/L — ABNORMAL HIGH (ref 38–126)
Anion gap: 8 (ref 5–15)
BUN: 14 mg/dL (ref 8–23)
CO2: 26 mmol/L (ref 22–32)
Calcium: 8.9 mg/dL (ref 8.9–10.3)
Chloride: 99 mmol/L (ref 98–111)
Creatinine, Ser: 0.73 mg/dL (ref 0.61–1.24)
GFR, Estimated: >60 mL/min (ref >60)
Glucose, Bld: 121 mg/dL — ABNORMAL HIGH (ref 70–99)
Potassium: 4 mmol/L (ref 3.5–5.1)
Sodium: 133 mmol/L — ABNORMAL LOW (ref 135–145)
Total Bilirubin: 2.7 mg/dL — ABNORMAL HIGH (ref 0.3–1.2)
Total Protein: 7 g/dL (ref 6.5–8.1)

## 2021-03-09 LAB — LACTIC ACID, PLASMA: Lactic Acid, Venous: 1.5 mmol/L (ref 0.5–1.9)

## 2021-03-09 NOTE — ED Notes (Signed)
No answer when called several times from lobby 

## 2021-03-09 NOTE — ED Triage Notes (Signed)
Pt comes with c/o hip pain. Pt states recent hip surgery. Pt states severe pain and swelling with some redness to site.

## 2021-03-09 NOTE — ED Notes (Signed)
No answer when called several times from lobby; no answer when phone # listed in chart called 

## 2021-03-10 ENCOUNTER — Other Ambulatory Visit: Payer: Self-pay | Admitting: Orthopedic Surgery

## 2021-03-10 ENCOUNTER — Ambulatory Visit
Admission: RE | Admit: 2021-03-10 | Discharge: 2021-03-10 | Disposition: A | Discharge status: Home / Self Care | Payer: Medicare Other | Source: Ambulatory Visit | Attending: Orthopedic Surgery | Admitting: Orthopedic Surgery

## 2021-03-10 ENCOUNTER — Telehealth: Payer: Self-pay | Admitting: Emergency Medicine

## 2021-03-10 DIAGNOSIS — M7989 Other specified soft tissue disorders: Secondary | ICD-10-CM | POA: Diagnosis not present

## 2021-03-10 NOTE — Telephone Encounter (Signed)
Called patient due to left emergency department before provider exam to inquire about condition and follow up plans.  Says he did call his doctor and they are doing some outpatient tests today.

## 2021-03-12 DIAGNOSIS — Z471 Aftercare following joint replacement surgery: Secondary | ICD-10-CM | POA: Diagnosis not present

## 2021-03-12 DIAGNOSIS — E785 Hyperlipidemia, unspecified: Secondary | ICD-10-CM | POA: Diagnosis not present

## 2021-03-12 DIAGNOSIS — I2510 Atherosclerotic heart disease of native coronary artery without angina pectoris: Secondary | ICD-10-CM | POA: Diagnosis not present

## 2021-03-12 DIAGNOSIS — K5792 Diverticulitis of intestine, part unspecified, without perforation or abscess without bleeding: Secondary | ICD-10-CM | POA: Diagnosis not present

## 2021-03-12 DIAGNOSIS — I38 Endocarditis, valve unspecified: Secondary | ICD-10-CM | POA: Diagnosis not present

## 2021-03-12 DIAGNOSIS — I252 Old myocardial infarction: Secondary | ICD-10-CM | POA: Diagnosis not present

## 2021-03-16 DIAGNOSIS — Z471 Aftercare following joint replacement surgery: Secondary | ICD-10-CM | POA: Diagnosis not present

## 2021-03-16 DIAGNOSIS — E785 Hyperlipidemia, unspecified: Secondary | ICD-10-CM | POA: Diagnosis not present

## 2021-03-16 DIAGNOSIS — K5792 Diverticulitis of intestine, part unspecified, without perforation or abscess without bleeding: Secondary | ICD-10-CM | POA: Diagnosis not present

## 2021-03-16 DIAGNOSIS — I252 Old myocardial infarction: Secondary | ICD-10-CM | POA: Diagnosis not present

## 2021-03-16 DIAGNOSIS — I38 Endocarditis, valve unspecified: Secondary | ICD-10-CM | POA: Diagnosis not present

## 2021-03-16 DIAGNOSIS — I2510 Atherosclerotic heart disease of native coronary artery without angina pectoris: Secondary | ICD-10-CM | POA: Diagnosis not present

## 2021-03-17 DIAGNOSIS — D649 Anemia, unspecified: Secondary | ICD-10-CM | POA: Diagnosis not present

## 2021-03-19 DIAGNOSIS — I38 Endocarditis, valve unspecified: Secondary | ICD-10-CM | POA: Diagnosis not present

## 2021-03-19 DIAGNOSIS — E785 Hyperlipidemia, unspecified: Secondary | ICD-10-CM | POA: Diagnosis not present

## 2021-03-19 DIAGNOSIS — I252 Old myocardial infarction: Secondary | ICD-10-CM | POA: Diagnosis not present

## 2021-03-19 DIAGNOSIS — Z471 Aftercare following joint replacement surgery: Secondary | ICD-10-CM | POA: Diagnosis not present

## 2021-03-19 DIAGNOSIS — I2510 Atherosclerotic heart disease of native coronary artery without angina pectoris: Secondary | ICD-10-CM | POA: Diagnosis not present

## 2021-03-19 DIAGNOSIS — K5792 Diverticulitis of intestine, part unspecified, without perforation or abscess without bleeding: Secondary | ICD-10-CM | POA: Diagnosis not present

## 2021-03-24 DIAGNOSIS — I2510 Atherosclerotic heart disease of native coronary artery without angina pectoris: Secondary | ICD-10-CM | POA: Diagnosis not present

## 2021-03-24 DIAGNOSIS — I252 Old myocardial infarction: Secondary | ICD-10-CM | POA: Diagnosis not present

## 2021-03-24 DIAGNOSIS — Z471 Aftercare following joint replacement surgery: Secondary | ICD-10-CM | POA: Diagnosis not present

## 2021-03-24 DIAGNOSIS — E785 Hyperlipidemia, unspecified: Secondary | ICD-10-CM | POA: Diagnosis not present

## 2021-03-24 DIAGNOSIS — I38 Endocarditis, valve unspecified: Secondary | ICD-10-CM | POA: Diagnosis not present

## 2021-03-24 DIAGNOSIS — K5792 Diverticulitis of intestine, part unspecified, without perforation or abscess without bleeding: Secondary | ICD-10-CM | POA: Diagnosis not present

## 2021-03-25 DIAGNOSIS — E785 Hyperlipidemia, unspecified: Secondary | ICD-10-CM | POA: Diagnosis not present

## 2021-03-25 DIAGNOSIS — I252 Old myocardial infarction: Secondary | ICD-10-CM | POA: Diagnosis not present

## 2021-03-25 DIAGNOSIS — I38 Endocarditis, valve unspecified: Secondary | ICD-10-CM | POA: Diagnosis not present

## 2021-03-25 DIAGNOSIS — Z471 Aftercare following joint replacement surgery: Secondary | ICD-10-CM | POA: Diagnosis not present

## 2021-03-25 DIAGNOSIS — I2510 Atherosclerotic heart disease of native coronary artery without angina pectoris: Secondary | ICD-10-CM | POA: Diagnosis not present

## 2021-03-25 DIAGNOSIS — K5792 Diverticulitis of intestine, part unspecified, without perforation or abscess without bleeding: Secondary | ICD-10-CM | POA: Diagnosis not present

## 2021-04-15 DIAGNOSIS — Z96641 Presence of right artificial hip joint: Secondary | ICD-10-CM | POA: Diagnosis not present

## 2021-04-15 DIAGNOSIS — M1611 Unilateral primary osteoarthritis, right hip: Secondary | ICD-10-CM | POA: Diagnosis not present

## 2021-05-07 DIAGNOSIS — Z23 Encounter for immunization: Secondary | ICD-10-CM | POA: Diagnosis not present

## 2021-05-27 ENCOUNTER — Ambulatory Visit: Payer: Medicare Other

## 2021-06-08 DIAGNOSIS — H353131 Nonexudative age-related macular degeneration, bilateral, early dry stage: Secondary | ICD-10-CM | POA: Diagnosis not present

## 2021-11-06 ENCOUNTER — Encounter: Payer: Self-pay | Admitting: Physician Assistant

## 2021-11-06 ENCOUNTER — Ambulatory Visit (INDEPENDENT_AMBULATORY_CARE_PROVIDER_SITE_OTHER): Payer: Medicare Other | Admitting: Physician Assistant

## 2021-11-06 VITALS — BP 110/65 | HR 67 | Temp 97.8°F | Resp 16 | Wt 124.0 lb

## 2021-11-06 DIAGNOSIS — R82998 Other abnormal findings in urine: Secondary | ICD-10-CM | POA: Diagnosis not present

## 2021-11-06 DIAGNOSIS — I25810 Atherosclerosis of coronary artery bypass graft(s) without angina pectoris: Secondary | ICD-10-CM

## 2021-11-06 DIAGNOSIS — Z862 Personal history of diseases of the blood and blood-forming organs and certain disorders involving the immune mechanism: Secondary | ICD-10-CM

## 2021-11-06 DIAGNOSIS — R079 Chest pain, unspecified: Secondary | ICD-10-CM

## 2021-11-06 DIAGNOSIS — R7401 Elevation of levels of liver transaminase levels: Secondary | ICD-10-CM

## 2021-11-06 DIAGNOSIS — R739 Hyperglycemia, unspecified: Secondary | ICD-10-CM

## 2021-11-06 LAB — POCT URINALYSIS DIPSTICK
Bilirubin, UA: NEGATIVE
Blood, UA: POSITIVE
Glucose, UA: NEGATIVE
Ketones, UA: NEGATIVE
Leukocytes, UA: NEGATIVE
Nitrite, UA: NEGATIVE
Protein, UA: NEGATIVE
Spec Grav, UA: 1.02 (ref 1.010–1.025)
Urobilinogen, UA: 0.2 E.U./dL
pH, UA: 6 (ref 5.0–8.0)

## 2021-11-06 MED ORDER — NITROGLYCERIN 0.4 MG SL SUBL: 0.4000 mg | SUBLINGUAL_TABLET | SUBLINGUAL | 3 refills | Status: AC | PRN

## 2021-11-06 MED ORDER — OMEPRAZOLE 40 MG PO CPDR: 40.0000 mg | DELAYED_RELEASE_CAPSULE | Freq: Every day | ORAL | 1 refills | Status: DC

## 2021-11-06 NOTE — Assessment & Plan Note (Addendum)
Chest pain/epigastric pain  ? ?Angina vs pud, gerd, cholecystitis, pancreatitis ? ?EKG in office today with no changes. ST depression I, II. Reviewed independently by Dr Caryn Section as well. ?Urgent ref back to cardio. ?Recommending starting omeprazole 40 mg in AM to cover possible PUD side of ddx ?Sending in nitro tabs, with strict ED precautions. ? ?-cmp, cbc, lipid, a1c ?

## 2021-11-06 NOTE — Progress Notes (Signed)
?  ? ? ?Established patient visit ? ?I,Bruce Moody,acting as a scribe for [name], PA-C.,have documented all relevant documentation on the behalf of Bruce Kirschner, PA-C,as directed by  Bruce Kirschner, PA-C while in the presence of Bruce Kirschner, PA-C. ? ? ?Patient: Bruce Moody   DOB: 05-Aug-1950   71 y.o. Male  MRN: [id number] ?Visit Date: 11/06/2021 ? ?Today's healthcare provider: [name], PA-C  ? ?Chief Complaint  ?Patient presents with  ? Follow-up  ? Hypertension  ? ?Subjective  ?  ?HPI  ? ?Bruce Moody is a 71 y/o male w/ pmh of CAD s/p CABG, HTN, HLD who presents today for evaluation of central chest pain/epigastric pain episodes that have occurred 3-4 times over the last month, the last episode being last Friday. The episode of pain come with diaphoresis, dizziness, nausea, near syncope. Usually last 30-40 minutes but the last occurred all day. Thinks his overall level of acid reflux has increased, he tried taking an antacid during this pain episode w/ no change in pain.  ?He also notes dark urine during the episodes of pain and a little while after. Denies radiation of pain. Denies associated vomiting. Denies syncope. ? ?Unsure if related to diet. Typically occurs early in AM when he first wakes up ,before food. ? ?Medications: ?Outpatient Medications Prior to Visit  ?Medication Sig  ? acetaminophen (TYLENOL) 500 MG tablet Take 500-1,000 mg by mouth every 6 (six) hours as needed for moderate pain.  ? aspirin EC 81 MG tablet Take 81 mg by mouth daily.  ? atorvastatin (LIPITOR) 80 MG tablet Take 1 tablet (80 mg total) by mouth daily.  ? carvedilol (COREG) 3.125 MG tablet Take 3.125 mg by mouth 2 (two) times daily with a meal.   ? clopidogrel (PLAVIX) 75 MG tablet Take 75 mg by mouth daily.   ? PRESCRIPTION MEDICATION Apply 1 application. topically 2 (two) times daily. FLUOROURACIL 5% + CALCIPOTRIENE 0.005% apply twice daily to forehead, nose, and left cheek for 4-5 days  ? ramipril (ALTACE) 2.5 MG  capsule Take 1 capsule by mouth daily.  ? amoxicillin (AMOXIL) 500 MG capsule Take 500 mg by mouth 3 (three) times daily. Take 4 capsules by mouth 1 hour prior to dental procedures. (Patient not taking: Reported on 11/06/2021)  ? [DISCONTINUED] docusate sodium (COLACE) 100 MG capsule Take 1 capsule (100 mg total) by mouth 2 (two) times daily. (Patient not taking: Reported on 11/06/2021)  ? [DISCONTINUED] ferrous [id number] C-folic acid (TRINSICON / FOLTRIN) capsule Take 1 capsule by mouth 2 (two) times daily. (Patient not taking: Reported on 11/06/2021)  ? [DISCONTINUED] HYDROcodone-acetaminophen (NORCO/VICODIN) 5-325 MG tablet Take 1-2 tablets by mouth every 4 (four) hours as needed for moderate pain (pain score 4-6). (Patient not taking: Reported on 11/06/2021)  ? [DISCONTINUED] methocarbamol (ROBAXIN) 500 MG tablet Take 1 tablet (500 mg total) by mouth every 6 (six) hours as needed for muscle spasms. (Patient not taking: Reported on 11/06/2021)  ? [DISCONTINUED] ramipril (ALTACE) 2.5 MG capsule Take 2.5 mg by mouth at bedtime.  (Patient not taking: Reported on 11/06/2021)  ? [DISCONTINUED] traMADol (ULTRAM) 50 MG tablet Take 1 tablet (50 mg total) by mouth every 6 (six) hours as needed. (Patient not taking: Reported on 11/06/2021)  ? ?No facility-administered medications prior to visit.  ? ? ?Review of Systems  ?Constitutional:  Negative for appetite change, chills and fever.  ?Respiratory:  Negative for chest tightness, shortness of breath and wheezing.   ?Cardiovascular:  Positive for chest pain. Negative  for palpitations.  ?Gastrointestinal:  Positive for abdominal pain. Negative for nausea and vomiting.  ?Genitourinary:  Positive for hematuria.  ?Neurological:  Positive for dizziness.  ? ? ?  Objective  ?  ?BP 110/65 (BP Location: Left Arm, Patient Position: Sitting, Cuff Size: Normal)   Pulse 67   Temp 97.8 ?F (36.6 ?C) (Temporal)   Resp 16   Wt 124 lb (56.2 kg)   SpO2 98%   BMI 21.97 kg/m?   ? ? ?Physical Exam ?Constitutional:   ?   General: He is awake.  ?   Appearance: He is well-developed.  ?HENT:  ?   Head: Normocephalic.  ?Eyes:  ?   Conjunctiva/sclera: Conjunctivae normal.  ?Cardiovascular:  ?   Rate and Rhythm: Normal rate and regular rhythm.  ?   Heart sounds: Normal heart sounds.  ?Pulmonary:  ?   Effort: Pulmonary effort is normal.  ?   Breath sounds: Normal breath sounds.  ?Abdominal:  ?   Palpations: Abdomen is soft. There is no mass.  ?   Tenderness: There is no abdominal tenderness. There is no guarding or rebound.  ?   Hernia: No hernia is present.  ?Musculoskeletal:  ?   Right lower leg: No edema.  ?   Left lower leg: No edema.  ?Skin: ?   General: Skin is warm.  ?Neurological:  ?   Mental Status: He is alert and oriented to person, place, and time.  ?Psychiatric:     ?   Attention and Perception: Attention normal.     ?   Mood and Affect: Mood normal.     ?   Speech: Speech normal.     ?   Behavior: Behavior is cooperative.  ?  ? ?Results for orders placed or performed in visit on 11/06/21  ?POCT Urinalysis Dipstick  ?Result Value Ref Range  ? Color, UA Yellow   ? Clarity, UA Clear   ? Glucose, UA Negative Negative  ? Bilirubin, UA Negative   ? Ketones, UA Negative   ? Spec Grav, UA 1.020 1.010 - 1.025  ? Blood, UA Positive   ? pH, UA 6.0 5.0 - 8.0  ? Protein, UA Negative Negative  ? Urobilinogen, UA 0.2 0.2 or 1.0 E.U./dL  ? Nitrite, UA Negative   ? Leukocytes, UA Negative Negative  ? ? Assessment & Plan  ?  ? ?Problem List Items Addressed This Visit   ? ?  ? Cardiovascular and Mediastinum  ? 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 Relevant Medications  ? ramipril (ALTACE) 2.5 MG capsule  ? nitroGLYCERIN (NITROSTAT) 0.4 MG SL tablet  ? Other Relevant Orders  ? Comprehensive Metabolic Panel (CMET)  ? Lipid Profile  ?  ? Other  ? Chest pain - Primary  ?  Chest pain/epigastric pain  ? ?Angina vs pud, gerd, cholecystitis, pancreatitis ? ?EKG in office today with no changes.  ST depression I, II. Reviewed independently by Dr Bruce Moody as well. ?Urgent ref back to cardio. ?Recommending starting omeprazole 40 mg in AM to cover possible PUD side of ddx ?Sending in nitro tabs, with strict ED precautions. ? ?-cmp, cbc, lipid, a1c ? ?  ?  ? Relevant Medications  ? omeprazole (PRILOSEC) 40 MG capsule  ? nitroGLYCERIN (NITROSTAT) 0.4 MG SL tablet  ? Other Relevant Orders  ? Comprehensive Metabolic Panel (CMET)  ? EKG 12-Lead (Completed)  ? Ambulatory referral to Cardiology  ? Dark brown urine  ?  -cbc, ua, urine  micro ? ?  ?  ? Relevant Orders  ? POCT Urinalysis Dipstick (Completed)  ? Urinalysis, microscopic only  ? Ambulatory referral to Cardiology  ? ?Other Visit Diagnoses   ? ? History of anemia      ? Relevant Orders  ? CBC w/Diff/Platelet  ? Hyperglycemia      ? Relevant Orders  ? HgB A1c  ? ?  ?  ?I, Bruce Kirschner, PA-C have reviewed all documentation for this visit. The documentation on  11/06/2021 for the exam, diagnosis, procedures, and orders are all accurate and complete. ? ?Bruce Kirschner, PA-C ?[facility] ?[id number] ?[place], [place], [address] ?Office: [phone] ?Fax: [phone]  ? ?[facility] ?

## 2021-11-06 NOTE — Assessment & Plan Note (Addendum)
-  cbc, ua, urine micro ?

## 2021-11-07 LAB — CBC WITH DIFFERENTIAL/PLATELET
Basophils Absolute: 0.1 10*3/uL (ref 0.0–0.2)
Basos: 1 %
EOS (ABSOLUTE): 0.2 10*3/uL (ref 0.0–0.4)
Eos: 2 %
Hematocrit: 41.5 % (ref 37.5–51.0)
Hemoglobin: 14.3 g/dL (ref 13.0–17.7)
Immature Grans (Abs): 0 10*3/uL (ref 0.0–0.1)
Immature Granulocytes: 0 %
Lymphocytes Absolute: 2.5 10*3/uL (ref 0.7–3.1)
Lymphs: 32 %
MCH: 32.8 pg (ref 26.6–33.0)
MCHC: 34.5 g/dL (ref 31.5–35.7)
MCV: 95 fL (ref 79–97)
Monocytes Absolute: 0.9 10*3/uL (ref 0.1–0.9)
Monocytes: 11 %
Neutrophils Absolute: 4.3 10*3/uL (ref 1.4–7.0)
Neutrophils: 54 %
Platelets: 310 10*3/uL (ref 150–450)
RBC: 4.36 x10E6/uL (ref 4.14–5.80)
RDW: 12 % (ref 11.6–15.4)
WBC: 7.9 10*3/uL (ref 3.4–10.8)

## 2021-11-07 LAB — COMPREHENSIVE METABOLIC PANEL
ALT: 106 IU/L — ABNORMAL HIGH (ref 0–44)
AST: 55 IU/L — ABNORMAL HIGH (ref 0–40)
Albumin/Globulin Ratio: 2 (ref 1.2–2.2)
Albumin: 4.6 g/dL (ref 3.7–4.7)
Alkaline Phosphatase: 203 IU/L — ABNORMAL HIGH (ref 44–121)
BUN/Creatinine Ratio: 16 (ref 10–24)
BUN: 12 mg/dL (ref 8–27)
Bilirubin Total: 0.9 mg/dL (ref 0.0–1.2)
CO2: 22 mmol/L (ref 20–29)
Calcium: 9 mg/dL (ref 8.6–10.2)
Chloride: 102 mmol/L (ref 96–106)
Creatinine, Ser: 0.76 mg/dL (ref 0.76–1.27)
Globulin, Total: 2.3 g/dL (ref 1.5–4.5)
Glucose: 88 mg/dL (ref 70–99)
Potassium: 4.5 mmol/L (ref 3.5–5.2)
Sodium: 139 mmol/L (ref 134–144)
Total Protein: 6.9 g/dL (ref 6.0–8.5)
eGFR: 95 mL/min/{1.73_m2} (ref >59)

## 2021-11-07 LAB — LIPID PANEL
Chol/HDL Ratio: 3.7 ratio (ref 0.0–5.0)
Cholesterol, Total: 146 mg/dL (ref 100–199)
HDL: 40 mg/dL (ref >39)
LDL Chol Calc (NIH): 88 mg/dL (ref 0–99)
Triglycerides: 98 mg/dL (ref 0–149)
VLDL Cholesterol Cal: 18 mg/dL (ref 5–40)

## 2021-11-07 LAB — HEMOGLOBIN A1C
Est. average glucose Bld gHb Est-mCnc: 117 mg/dL
Hgb A1c MFr Bld: 5.7 % — ABNORMAL HIGH (ref 4.8–5.6)

## 2021-11-09 ENCOUNTER — Other Ambulatory Visit: Payer: Self-pay | Admitting: Physician Assistant

## 2021-11-09 DIAGNOSIS — R748 Abnormal levels of other serum enzymes: Secondary | ICD-10-CM

## 2021-11-09 DIAGNOSIS — R1013 Epigastric pain: Secondary | ICD-10-CM

## 2021-11-10 ENCOUNTER — Telehealth: Payer: Self-pay

## 2021-11-10 LAB — URINALYSIS, MICROSCOPIC ONLY

## 2021-11-10 LAB — SPECIMEN STATUS REPORT

## 2021-11-12 ENCOUNTER — Other Ambulatory Visit: Payer: Self-pay

## 2021-11-12 ENCOUNTER — Ambulatory Visit
Admission: RE | Admit: 2021-11-12 | Discharge: 2021-11-12 | Disposition: A | Discharge status: Home / Self Care | Payer: Medicare Other | Source: Ambulatory Visit | Attending: Physician Assistant | Admitting: Physician Assistant

## 2021-11-12 DIAGNOSIS — R748 Abnormal levels of other serum enzymes: Secondary | ICD-10-CM | POA: Diagnosis present

## 2021-11-12 DIAGNOSIS — R1013 Epigastric pain: Secondary | ICD-10-CM | POA: Insufficient documentation

## 2021-11-13 ENCOUNTER — Other Ambulatory Visit: Payer: Self-pay | Admitting: Physician Assistant

## 2021-11-13 DIAGNOSIS — R748 Abnormal levels of other serum enzymes: Secondary | ICD-10-CM

## 2021-11-13 DIAGNOSIS — R1013 Epigastric pain: Secondary | ICD-10-CM

## 2021-11-13 LAB — HEPATITIS C ANTIBODY: Hep C Virus Ab: NONREACTIVE

## 2021-11-13 LAB — IRON,TIBC AND FERRITIN PANEL
Ferritin: 262 ng/mL (ref 30–400)
Iron Saturation: 35 % (ref 15–55)
Iron: 104 ug/dL (ref 38–169)
Total Iron Binding Capacity: 297 ug/dL (ref 250–450)
UIBC: 193 ug/dL (ref 111–343)

## 2021-11-13 LAB — HEPATITIS A ANTIBODY, IGM: Hep A IgM: NEGATIVE

## 2021-11-13 LAB — GAMMA GT: GGT: 281 IU/L — ABNORMAL HIGH (ref 0–65)

## 2021-11-13 LAB — HEPATITIS B SURFACE ANTIGEN: Hepatitis B Surface Ag: NEGATIVE

## 2021-11-24 ENCOUNTER — Encounter: Payer: Self-pay | Admitting: Physician Assistant

## 2021-12-16 NOTE — Addendum Note (Signed)
Addended by: Lelon Huh E on: 12/16/2021 02:05 PM   Modules accepted: Orders

## 2022-01-13 ENCOUNTER — Other Ambulatory Visit: Payer: Self-pay

## 2022-01-13 ENCOUNTER — Encounter: Payer: Self-pay | Admitting: Internal Medicine

## 2022-01-13 ENCOUNTER — Ambulatory Visit
Admission: RE | Admit: 2022-01-13 | Discharge: 2022-01-13 | Disposition: A | Discharge status: Home / Self Care | Payer: Medicare Other | Attending: Internal Medicine | Admitting: Internal Medicine

## 2022-01-13 ENCOUNTER — Encounter
Admission: RE | Disposition: A | Discharge status: Home / Self Care | Payer: Self-pay | Source: Home / Self Care | Attending: Internal Medicine

## 2022-01-13 DIAGNOSIS — I252 Old myocardial infarction: Secondary | ICD-10-CM | POA: Diagnosis not present

## 2022-01-13 DIAGNOSIS — R109 Unspecified abdominal pain: Secondary | ICD-10-CM | POA: Diagnosis not present

## 2022-01-13 DIAGNOSIS — R9430 Abnormal result of cardiovascular function study, unspecified: Secondary | ICD-10-CM | POA: Insufficient documentation

## 2022-01-13 DIAGNOSIS — I2582 Chronic total occlusion of coronary artery: Secondary | ICD-10-CM | POA: Diagnosis not present

## 2022-01-13 DIAGNOSIS — I25810 Atherosclerosis of coronary artery bypass graft(s) without angina pectoris: Secondary | ICD-10-CM | POA: Insufficient documentation

## 2022-01-13 DIAGNOSIS — R0789 Other chest pain: Secondary | ICD-10-CM | POA: Diagnosis not present

## 2022-01-13 HISTORY — PX: LEFT HEART CATH AND CORS/GRAFTS ANGIOGRAPHY: CATH118250

## 2022-01-13 SURGERY — LEFT HEART CATH AND CORS/GRAFTS ANGIOGRAPHY
Anesthesia: Moderate Sedation

## 2022-01-13 MED ORDER — HEPARIN (PORCINE) IN NACL 1000-0.9 UT/500ML-% IV SOLN
INTRAVENOUS | Status: AC
  Filled 2022-01-13: qty 500

## 2022-01-13 MED ORDER — SODIUM CHLORIDE 0.9 % IV SOLN: 250.0000 mL | INTRAVENOUS | Status: DC | PRN

## 2022-01-13 MED ORDER — LIDOCAINE HCL (PF) 1 % IJ SOLN
INTRAMUSCULAR | Status: DC | PRN
  Administered 2022-01-13: 5 mL

## 2022-01-13 MED ORDER — MIDAZOLAM HCL 2 MG/2ML IJ SOLN
INTRAMUSCULAR | Status: AC
  Filled 2022-01-13: qty 2

## 2022-01-13 MED ORDER — SODIUM CHLORIDE 0.9 % WEIGHT BASED INFUSION
3.0000 mL/kg/h | INTRAVENOUS | Status: AC
Start: 2022-01-13 — End: 2022-01-13
  Administered 2022-01-13: 3 mL/kg/h via INTRAVENOUS

## 2022-01-13 MED ORDER — ASPIRIN 81 MG PO CHEW: 81.0000 mg | CHEWABLE_TABLET | ORAL | Status: DC

## 2022-01-13 MED ORDER — ONDANSETRON HCL 4 MG/2ML IJ SOLN: 4.0000 mg | Freq: Four times a day (QID) | INTRAMUSCULAR | Status: DC | PRN

## 2022-01-13 MED ORDER — IOHEXOL 300 MG/ML  SOLN
INTRAMUSCULAR | Status: DC | PRN
  Administered 2022-01-13: 155 mL

## 2022-01-13 MED ORDER — MIDAZOLAM HCL 2 MG/2ML IJ SOLN
INTRAMUSCULAR | Status: DC | PRN
  Administered 2022-01-13: 1 mg via INTRAVENOUS

## 2022-01-13 MED ORDER — SODIUM CHLORIDE 0.9 % WEIGHT BASED INFUSION
1.0000 mL/kg/h | INTRAVENOUS | Status: DC
Start: 2022-01-13 — End: 2022-01-13
  Administered 2022-01-13: 1 mL/kg/h via INTRAVENOUS

## 2022-01-13 MED ORDER — HYDRALAZINE HCL 20 MG/ML IJ SOLN: 10.0000 mg | INTRAMUSCULAR | Status: DC | PRN

## 2022-01-13 MED ORDER — LIDOCAINE HCL 1 % IJ SOLN
INTRAMUSCULAR | Status: AC
  Filled 2022-01-13: qty 20

## 2022-01-13 MED ORDER — SODIUM CHLORIDE 0.9% FLUSH: 3.0000 mL | INTRAVENOUS | Status: DC | PRN

## 2022-01-13 MED ORDER — FENTANYL CITRATE (PF) 100 MCG/2ML IJ SOLN
INTRAMUSCULAR | Status: DC | PRN
  Administered 2022-01-13: 25 ug via INTRAVENOUS

## 2022-01-13 MED ORDER — ACETAMINOPHEN 325 MG PO TABS: 650.0000 mg | ORAL_TABLET | ORAL | Status: DC | PRN

## 2022-01-13 MED ORDER — LABETALOL HCL 5 MG/ML IV SOLN: 10.0000 mg | INTRAVENOUS | Status: DC | PRN

## 2022-01-13 MED ORDER — HEPARIN (PORCINE) IN NACL 1000-0.9 UT/500ML-% IV SOLN
INTRAVENOUS | Status: DC | PRN
  Administered 2022-01-13 (×2): 500 mL

## 2022-01-13 MED ORDER — SODIUM CHLORIDE 0.9% FLUSH
3.0000 mL | Freq: Two times a day (BID) | INTRAVENOUS | Status: DC
Start: 2022-01-13 — End: 2022-01-13

## 2022-01-13 MED ORDER — SODIUM CHLORIDE 0.9 % WEIGHT BASED INFUSION
1.0000 mL/kg/h | INTRAVENOUS | Status: DC
Start: 2022-01-13 — End: 2022-01-13

## 2022-01-13 MED ORDER — FENTANYL CITRATE (PF) 100 MCG/2ML IJ SOLN
INTRAMUSCULAR | Status: AC
  Filled 2022-01-13: qty 2

## 2022-01-13 SURGICAL SUPPLY — 12 items
CATH INFINITI 5 FR IM (CATHETERS) ×2 IMPLANT
CATH INFINITI 5FR MULTPACK ANG (CATHETERS) ×2 IMPLANT
DEVICE CLOSURE MYNXGRIP 5F (Vascular Products) ×2 IMPLANT
DRAPE BRACHIAL (DRAPES) IMPLANT
NDL PERC 18GX7CM (NEEDLE) IMPLANT
NEEDLE PERC 18GX7CM (NEEDLE) ×3 IMPLANT
PACK CARDIAC CATH (CUSTOM PROCEDURE TRAY) ×3 IMPLANT
PROTECTION STATION PRESSURIZED (MISCELLANEOUS) ×3
SET ATX SIMPLICITY (MISCELLANEOUS) ×2 IMPLANT
SHEATH AVANTI 5FR X 11CM (SHEATH) ×2 IMPLANT
STATION PROTECTION PRESSURIZED (MISCELLANEOUS) ×1 IMPLANT
WIRE GUIDERIGHT .035X150 (WIRE) ×2 IMPLANT

## 2022-01-14 ENCOUNTER — Encounter: Payer: Self-pay | Admitting: Internal Medicine

## 2022-02-03 ENCOUNTER — Other Ambulatory Visit: Payer: Self-pay

## 2022-02-03 ENCOUNTER — Emergency Department
Admission: EM | Admit: 2022-02-03 | Discharge: 2022-02-04 | Disposition: A | Discharge status: Home / Self Care | Payer: Medicare Other | Attending: Emergency Medicine | Admitting: Emergency Medicine

## 2022-02-03 DIAGNOSIS — Z7902 Long term (current) use of antithrombotics/antiplatelets: Secondary | ICD-10-CM | POA: Diagnosis not present

## 2022-02-03 DIAGNOSIS — L7621 Postprocedural hemorrhage and hematoma of skin and subcutaneous tissue following a dermatologic procedure: Secondary | ICD-10-CM | POA: Insufficient documentation

## 2022-02-03 HISTORY — PX: MOHS SURGERY: SHX181

## 2022-02-03 MED ORDER — LIDOCAINE HCL (PF) 1 % IJ SOLN
5.0000 mL | Freq: Once | INTRAMUSCULAR | Status: AC
  Administered 2022-02-03: 5 mL
  Filled 2022-02-03: qty 5

## 2022-02-03 MED ORDER — LIDOCAINE-EPINEPHRINE-TETRACAINE (LET) SOLUTION
3.0000 mL | Freq: Once | NASAL | Status: AC
  Administered 2022-02-03: 3 mL via TOPICAL
  Filled 2022-02-03: qty 3

## 2022-02-03 MED ORDER — TRANEXAMIC ACID FOR EPISTAXIS
500.0000 mg | Freq: Once | TOPICAL | Status: AC
  Administered 2022-02-03: 500 mg via TOPICAL
  Filled 2022-02-03: qty 10

## 2022-02-03 NOTE — ED Provider Notes (Signed)
[facility] Emergency Department Provider Note     Event Date/Time   First MD Initiated Contact with Patient 02/03/22 2001     (approximate)   History   Post-op Problem   HPI  Bruce Moody is a 71 y.o. male presents to the ED for evaluation of post-op bleeding. 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 His [facility] dermatologist performed a nasal ala reconstruction using grafted tissue from is cheek. The patient is on Plavix and was not asked to hold any doses prior to the procedure. Bleeding stated spontaneously at 1700, he applied direct pressure over the dressing, before contacting his provider and being advised to report to the ED. He denies FCS or pain.   Physical Exam   Triage Vital Signs: ED Triage Vitals  Enc Vitals Group     BP 02/03/22 1934 (!) 144/80     Pulse Rate 02/03/22 1934 70     Resp 02/03/22 1934 16     Temp 02/03/22 1934 98.2 F (36.8 C)     Temp Source 02/03/22 1934 Oral     SpO2 02/03/22 1934 98 %     Weight --      Height --      Head Circumference --      Peak Flow --      Pain Score 02/03/22 1935 0     Pain Loc --      Pain Edu? --      Excl. in [place]? --     Most recent vital signs: Vitals:   02/03/22 1934 02/04/22 0045  BP: (!) 144/80 127/76  Pulse: 70 70  Resp: 16 17  Temp: 98.2 F (36.8 C) 97.9 F (36.6 C)  SpO2: 98% 98%    General Awake, no distress.  HEENT NCAT. PERRL. EOMI. No rhinorrhea. Mucous membranes are moist.  CV:  Good peripheral perfusion.  RESP:  Normal effort.  ABD:  No distention.  SKIN:  Active bleeding noted from the cheek graft fashioned into a left nasal ala. Sutures in place without wound dehiscence.   ED Results / Procedures / Treatments   Labs (all labs ordered are listed, but only abnormal results are displayed) Labs Reviewed - No data to display   EKG    RADIOLOGY   No results found.   PROCEDURES:  Critical Care performed:  No  Procedures   MEDICATIONS ORDERED IN ED: Medications  tranexamic acid (CYKLOKAPRON) 1000 MG/10ML topical solution 500 mg (500 mg Topical Given by Other 02/03/22 2343)  lidocaine-EPINEPHrine-tetracaine (LET) solution (3 mLs Topical Given by Other 02/03/22 2344)  lidocaine (PF) (XYLOCAINE) 1 % injection 5 mL (5 mLs Infiltration Given by Other 02/03/22 2344)     IMPRESSION / MDM / ASSESSMENT AND PLAN / ED COURSE  I reviewed the triage vital signs and the nursing notes.                              Differential diagnosis includes, but is not limited to, wound dehiscence, cellulitis, hemorrhage  Patient's presentation is most consistent with acute, uncomplicated illness.  ----------------------------------------- 11:05 PM on 02/03/2022 ----------------------------------------- S/W Dr. Manley Mason via patient's telephone. He suggests electrocautery to the arteriole after failure of TXA and LET to stem bleeding.  Bleeding was ultimately controlled after electrocautery and 10 minutes of direct pressure over the vessel with a nose clip and spontaneous application of  TXA.  Patient's diagnosis is consistent with post-op hemorrhage. Patient will be discharged home with prescriptions for wound care instructions and supplies. Patient is to follow up with Dr. Manley Mason tomorrow morning as needed directed. Patient is given ED precautions to return to the ED for any worsening or new symptoms.     FINAL CLINICAL IMPRESSION(S) / ED DIAGNOSES   Final diagnoses:  Postoperative hemorrhage of skin following dermatologic procedure     Rx / DC Orders   ED Discharge Orders     None        Note:  This document was prepared using Dragon voice recognition software and may include unintentional dictation errors.    Melvenia Needles, PA-C 02/04/22 2308    Rada Hay, MD 02/05/22 [phone]

## 2022-02-03 NOTE — Discharge Instructions (Addendum)
See Dr Manley Mason as planned.

## 2022-02-03 NOTE — ED Provider Triage Note (Signed)
Emergency Medicine Provider Triage Evaluation Note  Bruce Moody, a 71 y.o. male  was evaluated in triage.  Pt complains of bleeding status post Mohs procedure.  Patient had a Mohs procedure performed earlier today at [facility].  He had a cancerous lesion removed with a skin graft from his cheek placed over it.  He reports bleeding started spontaneously about 1700 hrs. today.  He applied pressure for about 45 minutes and reported to the ED for further evaluation.  Bleeding appears to be stopped at this time.  Review of Systems  Positive: Post-op bleeding  Negative: FCS  Physical Exam  BP (!) 144/80   Pulse 70   Temp 98.2 F (36.8 C) (Oral)   Resp 16   SpO2 98%  Gen:   Awake, no distress  NAD Resp:  Normal effort  MSK:   Moves extremities without difficulty  Other:    Medical Decision Making  Medically screening exam initiated at 7:41 PM.  Appropriate orders placed.  Bruce Moody was informed that the remainder of the evaluation will be completed by another provider, this initial triage assessment does not replace that evaluation, and the importance of remaining in the ED until their evaluation is complete.  Patient to the ED for evaluation of spontaneous bleeding from his Mohs surgery procedure today.   Melvenia Needles, PA-C 02/03/22 1944

## 2022-02-03 NOTE — ED Triage Notes (Signed)
Pt presents to ER from home c/o nose/facial bleeding that started today after surgery at [facility] today.  Pt states he had some skin cancer removed with graft placed over.  Bleeding started around 1700 today, and has been slowly oozing.  Bleeding currently controlled.  Pt is A&O x4 at this time and in NAD.

## 2022-02-04 NOTE — ED Notes (Signed)
Pt c/o of facial bleeding. Pt states he had a procedure today where they removed some skin cancer. Pt states the site has been bleeding since 1700. Provider currently at bedside performing a procedure.

## 2022-02-04 NOTE — ED Notes (Signed)
Pt verbalized understanding of discharge instructions and follow-up care instructions. Pt advised if symptoms worsen to return to ED.  

## 2022-02-21 ENCOUNTER — Encounter: Payer: Self-pay | Admitting: Family Medicine

## 2022-02-21 DIAGNOSIS — Z85828 Personal history of other malignant neoplasm of skin: Secondary | ICD-10-CM | POA: Insufficient documentation

## 2022-02-23 ENCOUNTER — Telehealth: Payer: Self-pay | Admitting: Family Medicine

## 2022-02-23 NOTE — Telephone Encounter (Signed)
Copied from [place] [id number]. Topic: Medicare AWV >> Feb 23, 2022  1:48 PM Jae Dire wrote: Reason for CRM:  Left message for patient to call back and schedule Medicare Annual Wellness Visit (AWV) in office.   If unable to come into the office for AWV,  please offer to do virtually or by telephone.  Last AWV: 02/12/2020  Please schedule at anytime with [facility] Health Advisor.  30 minute appointment for Virtual or phone 45 minute appointment for in office or Initial virtual/phone  Any questions, please contact me at [phone]

## 2022-03-09 DIAGNOSIS — D0359 Melanoma in situ of other part of trunk: Secondary | ICD-10-CM | POA: Insufficient documentation

## 2022-03-09 DIAGNOSIS — D0330 Melanoma in situ of unspecified part of face: Secondary | ICD-10-CM | POA: Insufficient documentation

## 2022-03-09 DIAGNOSIS — K4090 Unilateral inguinal hernia, without obstruction or gangrene, not specified as recurrent: Secondary | ICD-10-CM | POA: Insufficient documentation

## 2022-03-10 ENCOUNTER — Ambulatory Visit (INDEPENDENT_AMBULATORY_CARE_PROVIDER_SITE_OTHER): Payer: Medicare Other

## 2022-03-10 VITALS — Wt 124.0 lb

## 2022-03-10 DIAGNOSIS — Z0000 Encounter for general adult medical examination without abnormal findings: Secondary | ICD-10-CM

## 2022-03-10 NOTE — Patient Instructions (Signed)
Bruce Moody , Thank you for taking time to come for your Medicare Wellness Visit. I appreciate your ongoing commitment to your health goals. Please review the following plan we discussed and let me know if I can assist you in the future.   Screening recommendations/referrals: Colonoscopy: 05/28/14, every 10 years Recommended yearly ophthalmology/optometry visit for glaucoma screening and checkup Recommended yearly dental visit for hygiene and checkup  Vaccinations: Influenza vaccine: 05/07/21 Pneumococcal vaccine: 10/06/18 Tdap vaccine: 02/20/14 Shingles vaccine: n/d   Covid-19: 08/31/19, 09/28/19, 06/02/20  Advanced directives: no  Conditions/risks identified: none  Next appointment: Follow up in one year for your annual wellness visit.- 03/14/23 @ 1:30 pm by phone  Preventive Care 65 Years and Older, Male Preventive care refers to lifestyle choices and visits with your health care provider that can promote health and wellness. What does preventive care include? A yearly physical exam. This is also called an annual well check. Dental exams once or twice a year. Routine eye exams. Ask your health care provider how often you should have your eyes checked. Personal lifestyle choices, including: Daily care of your teeth and gums. Regular physical activity. Eating a healthy diet. Avoiding tobacco and drug use. Limiting alcohol use. Practicing safe sex. Taking low doses of aspirin every day. Taking vitamin and mineral supplements as recommended by your health care provider. What happens during an annual well check? The services and screenings done by your health care provider during your annual well check will depend on your age, overall health, lifestyle risk factors, and family history of disease. Counseling  Your health care provider may ask you questions about your: Alcohol use. Tobacco use. Drug use. Emotional well-being. Home and relationship well-being. Sexual  activity. Eating habits. History of falls. Memory and ability to understand (cognition). Work and work Statistician. Screening  You may have the following tests or measurements: Height, weight, and BMI. Blood pressure. Lipid and cholesterol levels. These may be checked every 5 years, or more frequently if you are over 40 years old. Skin check. Lung cancer screening. You may have this screening every year starting at age 71 if you have a 30-pack-year history of smoking and currently smoke or have quit within the past 15 years. Fecal occult blood test (FOBT) of the stool. You may have this test every year starting at age 61. Flexible sigmoidoscopy or colonoscopy. You may have a sigmoidoscopy every 5 years or a colonoscopy every 10 years starting at age 32. Prostate cancer screening. Recommendations will vary depending on your family history and other risks. Hepatitis C blood test. Hepatitis B blood test. Sexually transmitted disease (STD) testing. Diabetes screening. This is done by checking your blood sugar (glucose) after you have not eaten for a while (fasting). You may have this done every 1-3 years. Abdominal aortic aneurysm (AAA) screening. You may need this if you are a current or former smoker. Osteoporosis. You may be screened starting at age 43 if you are at high risk. Talk with your health care provider about your test results, treatment options, and if necessary, the need for more tests. Vaccines  Your health care provider may recommend certain vaccines, such as: Influenza vaccine. This is recommended every year. Tetanus, diphtheria, and acellular pertussis (Tdap, Td) vaccine. You may need a Td booster every 10 years. Zoster vaccine. You may need this after age 67. Pneumococcal 13-valent conjugate (PCV13) vaccine. One dose is recommended after age 72. Pneumococcal polysaccharide (PPSV23) vaccine. One dose is recommended after age 73. Talk to  your health care provider about which  screenings and vaccines you need and how often you need them. This information is not intended to replace advice given to you by your health care provider. Make sure you discuss any questions you have with your health care provider. Document Released: 08/01/2015 Document Revised: 03/24/2016 Document Reviewed: 05/06/2015 Elsevier Interactive Patient Education  2017 [place] Prevention in the Home Falls can cause injuries. They can happen to people of all ages. There are many things you can do to make your home safe and to help prevent falls. What can I do on the outside of my home? Regularly fix the edges of walkways and driveways and fix any cracks. Remove anything that might make you trip as you walk through a door, such as a raised step or threshold. Trim any bushes or trees on the path to your home. Use bright outdoor lighting. Clear any walking paths of anything that might make someone trip, such as rocks or tools. Regularly check to see if handrails are loose or broken. Make sure that both sides of any steps have handrails. Any raised decks and porches should have guardrails on the edges. Have any leaves, snow, or ice cleared regularly. Use sand or salt on walking paths during winter. Clean up any spills in your garage right away. This includes oil or grease spills. What can I do in the bathroom? Use night lights. Install grab bars by the toilet and in the tub and shower. Do not use towel bars as grab bars. Use non-skid mats or decals in the tub or shower. If you need to sit down in the shower, use a plastic, non-slip stool. Keep the floor dry. Clean up any water that spills on the floor as soon as it happens. Remove soap buildup in the tub or shower regularly. Attach bath mats securely with double-sided non-slip rug tape. Do not have throw rugs and other things on the floor that can make you trip. What can I do in the bedroom? Use night lights. Make sure that you have a  light by your bed that is easy to reach. Do not use any sheets or blankets that are too big for your bed. They should not hang down onto the floor. Have a firm chair that has side arms. You can use this for support while you get dressed. Do not have throw rugs and other things on the floor that can make you trip. What can I do in the kitchen? Clean up any spills right away. Avoid walking on wet floors. Keep items that you use a lot in easy-to-reach places. If you need to reach something above you, use a strong step stool that has a grab bar. Keep electrical cords out of the way. Do not use floor polish or wax that makes floors slippery. If you must use wax, use non-skid floor wax. Do not have throw rugs and other things on the floor that can make you trip. What can I do with my stairs? Do not leave any items on the stairs. Make sure that there are handrails on both sides of the stairs and use them. Fix handrails that are broken or loose. Make sure that handrails are as long as the stairways. Check any carpeting to make sure that it is firmly attached to the stairs. Fix any carpet that is loose or worn. Avoid having throw rugs at the top or bottom of the stairs. If you do have throw rugs, attach  them to the floor with carpet tape. Make sure that you have a light switch at the top of the stairs and the bottom of the stairs. If you do not have them, ask someone to add them for you. What else can I do to help prevent falls? Wear shoes that: Do not have high heels. Have rubber bottoms. Are comfortable and fit you well. Are closed at the toe. Do not wear sandals. If you use a stepladder: Make sure that it is fully opened. Do not climb a closed stepladder. Make sure that both sides of the stepladder are locked into place. Ask someone to hold it for you, if possible. Clearly mark and make sure that you can see: Any grab bars or handrails. First and last steps. Where the edge of each step  is. Use tools that help you move around (mobility aids) if they are needed. These include: Canes. Walkers. Scooters. Crutches. Turn on the lights when you go into a dark area. Replace any light bulbs as soon as they burn out. Set up your furniture so you have a clear path. Avoid moving your furniture around. If any of your floors are uneven, fix them. If there are any pets around you, be aware of where they are. Review your medicines with your doctor. Some medicines can make you feel dizzy. This can increase your chance of falling. Ask your doctor what other things that you can do to help prevent falls. This information is not intended to replace advice given to you by your health care provider. Make sure you discuss any questions you have with your health care provider. Document Released: 05/01/2009 Document Revised: 12/11/2015 Document Reviewed: 08/09/2014 Elsevier Interactive Patient Education  2017 [employer].

## 2022-03-10 NOTE — Progress Notes (Signed)
Virtual Visit via Telephone Note  I connected with  English on 03/10/22 at  1:00 PM EDT by telephone and verified that I am speaking with the correct person using two identifiers.  Location: Patient: home Provider: [facility] Persons participating in the virtual visit: [place]   I discussed the limitations, risks, security and privacy concerns of performing an evaluation and management service by telephone and the availability of in person appointments. The patient expressed understanding and agreed to proceed.  Interactive audio and video telecommunications were attempted between this nurse and patient, however failed, due to patient having technical difficulties OR patient did not have access to video capability.  We continued and completed visit with audio only.  Some vital signs may be absent or patient reported.   Dionisio David, LPN  Subjective:   Bruce Moody is a 71 y.o. male who presents for Medicare Annual/Subsequent preventive examination.  Review of Systems     Cardiac Risk Factors include: advanced age (>75mn, >>45women);dyslipidemia;hypertension;male gender     Objective:    There were no vitals filed for this visit. There is no height or weight on file to calculate BMI.     03/10/2022    1:01 PM 01/13/2022   10:46 AM 03/09/2021    4:15 PM 03/03/2021    8:56 AM 02/24/2021    8:23 AM 10/07/2020    8:57 AM 02/12/2020    9:07 AM  Advanced Directives  Does Patient Have a Medical Advance Directive? No Yes Yes Yes Yes Yes Yes  Type of ASocial research officer, governmentLiving will Living will Living will Living will HWashingtonLiving will H[place]mannLiving will  Does patient want to make changes to medical advance directive?  No - Patient declined  No - Patient declined  No - Patient declined   Copy of [place] Chart?      No - copy requested No - copy requested  Would  patient like information on creating a medical advance directive? No - Patient declined          Current Medications (verified) Outpatient Encounter Medications as of 03/10/2022  Medication Sig   acetaminophen (TYLENOL) 500 MG tablet Take 500-1,000 mg by mouth every 6 (six) hours as needed for moderate pain.   aspirin EC 81 MG tablet Take 81 mg by mouth daily.   atorvastatin (LIPITOR) 80 MG tablet Take 1 tablet (80 mg total) by mouth daily.   carvedilol (COREG) 3.125 MG tablet Take 3.125 mg by mouth 2 (two) times daily with a meal.    clopidogrel (PLAVIX) 75 MG tablet Take 75 mg by mouth daily.    nitroGLYCERIN (NITROSTAT) 0.4 MG SL tablet Place 1 tablet (0.4 mg total) under the tongue every 5 (five) minutes as needed for chest pain.   omeprazole (PRILOSEC) 40 MG capsule Take 1 capsule (40 mg total) by mouth daily.   ramipril (ALTACE) 2.5 MG capsule Take 1 capsule by mouth daily.   PRESCRIPTION MEDICATION Apply 1 application. topically 2 (two) times daily. FLUOROURACIL 5% + CALCIPOTRIENE 0.005% apply twice daily to forehead, nose, and left cheek for 4-5 days (Patient not taking: Reported on 03/10/2022)   No facility-administered encounter medications on file as of 03/10/2022.    Allergies (verified) Patient has no known allergies.   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   Coronary artery disease    Heart disease    History of kidney stones    Hypertension    Myocardial infarction ([place]) 1998   Past Surgical History:  Procedure Laterality Date   BASAL CELL CARCINOMA EXCISION     right eyelid   CARDIAC CATHETERIZATION     CATARACT EXTRACTION W/PHACO Right 10/07/2020   Procedure: CATARACT EXTRACTION PHACO AND INTRAOCULAR LENS PLACEMENT (IOC) RIGHT 8.57 00:43.5;  Surgeon: Birder Robson, MD;  Location: [facility];  Service: Ophthalmology;  Laterality: Right;   COLONOSCOPY  2004   CORONARY ANGIOPLASTY     CORONARY ARTERY BYPASS GRAFT  1998   CORONARY ARTERY BYPASS GRAFT     3 vessels   HERNIA REPAIR Left 2008   inguinal   HERNIA REPAIR Right 07/05/2013   inguinal    LEFT HEART CATH AND CORS/GRAFTS ANGIOGRAPHY N/A 01/13/2022   Procedure: LEFT HEART CATH AND CORS/GRAFTS ANGIOGRAPHY;  Surgeon: Corey Skains, MD;  Location: [facility];  Service: Cardiovascular;  Laterality: N/A;   MOHS SURGERY  02/03/2022   ROTATOR CUFF REPAIR  2005   TONSILLECTOMY     TOTAL HIP ARTHROPLASTY Left 06/08/2016   Procedure: TOTAL HIP ARTHROPLASTY ANTERIOR APPROACH;  Surgeon: Hessie Knows, MD;  Location: [facility];  Service: Orthopedics;  Laterality: Left;   TOTAL HIP ARTHROPLASTY Right 03/03/2021   Procedure: TOTAL HIP ARTHROPLASTY ANTERIOR APPROACH;  Surgeon: Hessie Knows, MD;  Location: [facility];  Service: Orthopedics;  Laterality: Right;   Family History  Problem Relation Age of Onset   Lymphoma Father    Heart disease Mother    Dementia Mother    Arthritis Sister    Heart disease Sister    COPD Sister    Social History   Socioeconomic History   Marital status: Married    Spouse name: Not on file   Number of children: 1   Years of education: Not on file   Highest education level: Bachelor's degree (e.g., BA, AB, BS)  Occupational History   Not on file  Tobacco Use   Smoking status: Former    Types: Cigarettes    Quit date: 07/19/1996    Years since quitting: 25.6   Smokeless tobacco: Never  Vaping Use   Vaping Use: Never used  Substance and Sexual Activity   Alcohol use: Yes    Alcohol/week: 5.0 - 6.0 standard drinks of alcohol    Types: 5 - 6 Glasses of wine per week    Comment: occasionally   Drug use: No   Sexual activity: Not on file  Other Topics Concern   Not on file  Social History Narrative   Not on file   Social Determinants of Health   Financial  Resource Strain: Low Risk  (03/10/2022)   Overall Financial Resource Strain (CARDIA)    Difficulty of Paying Living Expenses: Not hard at all  Food Insecurity: No Food Insecurity (03/10/2022)   Hunger Vital Sign    Worried About Running Out of Food in the Last Year: Never true    Ran Out of Food in the Last Year: Never true  Transportation Needs: No Transportation Needs (03/10/2022)   PRAPARE - Hydrologist (Medical): No    Lack of Transportation (Non-Medical): No  Physical Activity: Sufficiently Active (03/10/2022)   Exercise Vital Sign  Days of Exercise per Week: 5 days    Minutes of Exercise per Session: 30 min  Stress: No Stress Concern Present (03/10/2022)   Alfred    Feeling of Stress : Not at all  Social Connections: Moderately Isolated (03/10/2022)   Social Connection and Isolation Panel [NHANES]    Frequency of Communication with Friends and Family: Three times a week    Frequency of Social Gatherings with Friends and Family: Once a week    Attends Religious Services: Never    Marine scientist or Organizations: No    Attends Music therapist: Never    Marital Status: Married    Tobacco Counseling Counseling given: Not Answered   Clinical Intake:  Pre-visit preparation completed: Yes  Pain : No/denies pain     Nutritional Risks: None Diabetes: No  How often do you need to have someone help you when you read instructions, pamphlets, or other written materials from your doctor or pharmacy?: 1 - Never  Diabetic?no  Interpreter Needed?: No  Information entered by :: Kirke Shaggy, LPN   Activities of Daily Living    03/10/2022    1:02 PM 01/13/2022   10:42 AM  In your present state of health, do you have any difficulty performing the following activities:  Hearing? 0 0  Vision? 0 0  Difficulty concentrating or making decisions? 0 0  Walking or  climbing stairs? 0 0  Dressing or bathing? 0 0  Doing errands, shopping? 0   Preparing Food and eating ? N   Using the Toilet? N   In the past six months, have you accidently leaked urine? N   Do you have problems with loss of bowel control? N   Managing your Medications? N   Managing your Finances? N   Housekeeping or managing your Housekeeping? N     Patient Care Team: Birdie Sons, MD as PCP - General (Family Medicine) Corey Skains, MD as Consulting Physician (Cardiology) Oneta Rack, MD as Consulting Physician (Dermatology) Hessie Knows, MD as Consulting Physician (Orthopedic Surgery)  Indicate any recent Medical Services you may have received from other than [facility] providers in the past year (date may be approximate).     Assessment:   This is a routine wellness examination for [place].  Hearing/Vision screen Hearing Screening - Comments:: No aids Vision Screening - Comments:: No glasses  Dietary issues and exercise activities discussed: Current Exercise Habits: Home exercise routine, Type of exercise: treadmill, Time (Minutes): 30, Frequency (Times/Week): 5, Weekly Exercise (Minutes/Week): 150, Intensity: Mild   Goals Addressed             This Visit's Progress    DIET - EAT MORE FRUITS AND VEGETABLES         Depression Screen    03/10/2022    1:00 PM 04/21/2020    8:22 AM 02/12/2020    9:04 AM 10/06/2018   10:44 AM 10/04/2017    1:08 PM  PHQ 2/9 Scores  PHQ - 2 Score 0 0 0 0 0  PHQ- 9 Score 0 2       Fall Risk    03/10/2022    1:02 PM 04/21/2020    8:23 AM 02/12/2020    9:08 AM 10/06/2018   10:44 AM 10/04/2017    1:08 PM  Fall Risk   Falls in the past year? 0 0 0 0 No  Number falls in past yr: 0  0    Injury with Fall? 0 0 0    Risk for fall due to : No Fall Risks No Fall Risks     Follow up Falls evaluation completed Falls evaluation completed       Theod[place]ia:  Any stairs in or around the home? No  If  so, are there any without handrails? No  Home free of loose throw rugs in walkways, pet beds, electrical cords, etc? Yes  Adequate lighting in your home to reduce risk of falls? Yes   ASSISTIVE DEVICES UTILIZED TO PREVENT FALLS:  Life alert? No  Use of a cane, walker or w/c? No  Grab bars in the bathroom? Yes  Shower chair or bench in shower? No  Elevated toilet seat or a handicapped toilet? No   Cognitive Function:        03/10/2022    1:03 PM 02/12/2020    9:11 AM 10/06/2018   10:46 AM  6CIT Screen  What Year? 0 points 0 points 0 points  What month? 0 points 0 points 0 points  What time? 0 points 0 points 0 points  Count back from 20 0 points 0 points 0 points  Months in reverse 0 points 0 points 0 points  Repeat phrase 0 points 0 points 0 points  Total Score 0 points 0 points 0 points    Immunizations Immunization History  Administered Date(s) Administered   Fluad Quad(high Dose 65+) 04/25/2019, 04/23/2020   Influenza Split 05/12/2011, 05/18/2012   Influenza, High Dose Seasonal PF 06/05/2015, 05/20/2016, 05/04/2017, 05/03/2018   Influenza,inj,Quad PF,6+ Mos 05/16/2013, 05/16/2014   Moderna Sars-Covid-2 Vaccination 08/31/2019, 09/28/2019, 06/02/2020   Pneumococcal Conjugate-13 10/04/2017   Pneumococcal Polysaccharide-23 10/06/2018   Tdap 02/20/2014    TDAP status: Up to date  Flu Vaccine status: Up to date  Pneumococcal vaccine status: Up to date  Covid-19 vaccine status: Completed vaccines  Qualifies for Shingles Vaccine? Yes   Zostavax completed No   Shingrix Completed?: No.    Education has been provided regarding the importance of this vaccine. Patient has been advised to call insurance company to determine out of pocket expense if they have not yet received this vaccine. Advised may also receive vaccine at local pharmacy or Health Dept. Verbalized acceptance and understanding.  Screening Tests Health Maintenance  Topic Date Due   Zoster Vaccines- Shingrix  (1 of 2) Never done   COVID-19 Vaccine (4 - Moderna risk series) 07/28/2020   INFLUENZA VACCINE  02/16/2022   TETANUS/TDAP  02/21/2024   COLONOSCOPY (Pts 45-40yr Insurance coverage will need to be confirmed)  05/28/2024   Pneumonia Vaccine 71 Years old  Completed   Hepatitis C Screening  Completed   HPV VACCINES  Aged Out    Health Maintenance  Health Maintenance Due  Topic Date Due   Zoster Vaccines- Shingrix (1 of 2) Never done   COVID-19 Vaccine (4 - Moderna risk series) 07/28/2020   INFLUENZA VACCINE  02/16/2022    Colorectal cancer screening: Type of screening: Colonoscopy. Completed 05/28/14. Repeat every 10 years  Lung Cancer Screening: (Low Dose CT Chest recommended if Age 73-80years, 30 pack-year currently smoking OR have quit w/in 15years.) does not qualify.   Additional Screening:  Hepatitis C Screening: does qualify; Completed 11/12/21  Vision Screening: Recommended annual ophthalmology exams for early detection of glaucoma and other disorders of the eye. Is the patient up to date with their annual eye exam?  Yes  Who is  the provider or what is the name of the office in which the patient attends annual eye exams? [facility] If pt is not established with a provider, would they like to be referred to a provider to establish care? No .   Dental Screening: Recommended annual dental exams for proper oral hygiene  Community Resource Referral / Chronic Care Management: CRR required this visit?  No   CCM required this visit?  No      Plan:     I have personally reviewed and noted the following in the patient's chart:   Medical and social history Use of alcohol, tobacco or illicit drugs  Current medications and supplements including opioid prescriptions. Patient is not currently taking opioid prescriptions. Functional ability and status Nutritional status Physical activity Advanced directives List of other physicians Hospitalizations, surgeries, and ER  visits in previous 12 months Vitals Screenings to include cognitive, depression, and falls Referrals and appointments  In addition, I have reviewed and discussed with patient certain preventive protocols, quality metrics, and best practice recommendations. A written personalized care plan for preventive services as well as general preventive health recommendations were provided to patient.     Dionisio David, LPN   10/15/5186   Nurse Notes: none

## 2022-04-01 ENCOUNTER — Ambulatory Visit: Payer: Medicare Other | Admitting: Gastroenterology

## 2022-05-04 ENCOUNTER — Other Ambulatory Visit: Payer: Self-pay

## 2022-05-04 ENCOUNTER — Telehealth: Payer: Self-pay | Admitting: Family Medicine

## 2022-05-04 DIAGNOSIS — R079 Chest pain, unspecified: Secondary | ICD-10-CM

## 2022-05-04 MED ORDER — OMEPRAZOLE 40 MG PO CPDR: 40.0000 mg | DELAYED_RELEASE_CAPSULE | Freq: Every day | ORAL | 1 refills | Status: DC

## 2022-05-04 NOTE — Telephone Encounter (Signed)
[facility] pharmacy faxed refill request for the following medications:    omeprazole (PRILOSEC) 40 MG capsule   Please advise  

## 2022-05-05 ENCOUNTER — Ambulatory Visit (INDEPENDENT_AMBULATORY_CARE_PROVIDER_SITE_OTHER): Payer: Medicare Other

## 2022-05-05 DIAGNOSIS — Z23 Encounter for immunization: Secondary | ICD-10-CM

## 2022-11-06 ENCOUNTER — Other Ambulatory Visit: Payer: Self-pay | Admitting: Family Medicine

## 2022-11-06 DIAGNOSIS — R079 Chest pain, unspecified: Secondary | ICD-10-CM

## 2022-12-14 ENCOUNTER — Encounter: Payer: Self-pay | Admitting: Physician Assistant

## 2022-12-14 ENCOUNTER — Ambulatory Visit
Admission: RE | Admit: 2022-12-14 | Discharge: 2022-12-14 | Disposition: A | Discharge status: Home / Self Care | Payer: Medicare Other | Source: Ambulatory Visit | Attending: Physician Assistant | Admitting: Physician Assistant

## 2022-12-14 ENCOUNTER — Ambulatory Visit (INDEPENDENT_AMBULATORY_CARE_PROVIDER_SITE_OTHER): Payer: Medicare Other | Admitting: Physician Assistant

## 2022-12-14 ENCOUNTER — Ambulatory Visit
Admission: RE | Admit: 2022-12-14 | Discharge: 2022-12-14 | Disposition: A | Discharge status: Home / Self Care | Payer: Medicare Other | Attending: Physician Assistant | Admitting: Physician Assistant

## 2022-12-14 VITALS — BP 108/58 | HR 79 | Temp 99.0°F | Ht 63.0 in | Wt 126.0 lb

## 2022-12-14 DIAGNOSIS — R0989 Other specified symptoms and signs involving the circulatory and respiratory systems: Secondary | ICD-10-CM

## 2022-12-14 DIAGNOSIS — R0609 Other forms of dyspnea: Secondary | ICD-10-CM | POA: Diagnosis not present

## 2022-12-14 DIAGNOSIS — R051 Acute cough: Secondary | ICD-10-CM | POA: Diagnosis present

## 2022-12-14 DIAGNOSIS — R0981 Nasal congestion: Secondary | ICD-10-CM | POA: Diagnosis present

## 2022-12-14 MED ORDER — BENZONATATE 100 MG PO CAPS: 100.0000 mg | ORAL_CAPSULE | Freq: Two times a day (BID) | ORAL | 0 refills | Status: DC | PRN

## 2022-12-14 NOTE — Progress Notes (Signed)
Established patient visit  Patient: Bruce Moody   DOB: 1951-04-29   72 y.o. Male  MRN: [id number] Visit Date: 12/14/2022  Today's healthcare provider: Debera Lat, PA-C   Chief Complaint  Patient presents with   Sore Throat   Subjective     HPI   Sore throat, constant coughing w/ clear mucus, low grade fever, sinus pressure--5 days Last edited by Shelly Bombard, CMA on 12/14/2022 10:59 AM.      States that started to feel worse yesterday night and decided to come to the office for evaluation. Reports having a sick contact: his wife has similar symptoms 2-3 weeks ago, she was prescribed symptomatic treatment and doing better now.      03/10/2022    1:00 PM 04/21/2020    8:22 AM 02/12/2020    9:04 AM  Depression screen PHQ 2/9  Decreased Interest 0 0 0  Down, Depressed, Hopeless 0 0 0  PHQ - 2 Score 0 0 0  Altered sleeping 0 1   Tired, decreased energy 0 1   Change in appetite 0 0   Feeling bad or failure about yourself  0 0   Trouble concentrating 0 0   Moving slowly or fidgety/restless 0 0   Suicidal thoughts 0 0   PHQ-9 Score 0 2   Difficult doing work/chores Not difficult at all Not difficult at all    Medications: Outpatient Medications Prior to Visit  Medication Sig   acetaminophen (TYLENOL) 500 MG tablet Take 500-1,000 mg by mouth every 6 (six) hours as needed for moderate pain.   aspirin EC 81 MG tablet Take 81 mg by mouth daily.   atorvastatin (LIPITOR) 80 MG tablet Take 1 tablet (80 mg total) by mouth daily.   carvedilol (COREG) 3.125 MG tablet Take 3.125 mg by mouth 2 (two) times daily with a meal.    clopidogrel (PLAVIX) 75 MG tablet Take 75 mg by mouth daily.    ezetimibe (ZETIA) 10 MG tablet Take 1 tablet by mouth daily.   nitroGLYCERIN (NITROSTAT) 0.4 MG SL tablet Place 1 tablet (0.4 mg total) under the tongue every 5 (five) minutes as needed for chest pain.   omeprazole (PRILOSEC) 40 MG capsule TAKE 1 CAPSULE BY MOUTH DAILY   PRESCRIPTION MEDICATION  Apply 1 application  topically 2 (two) times daily. FLUOROURACIL 5% + CALCIPOTRIENE 0.005% apply twice daily to forehead, nose, and left cheek for 4-5 days   ramipril (ALTACE) 2.5 MG capsule Take 1 capsule by mouth daily.   No facility-administered medications prior to visit.    Review of Systems  All other systems reviewed and are negative.  Except see HPI      Objective    BP (!) 108/58 (BP Location: Right Arm, Patient Position: Sitting, Cuff Size: Normal)   Pulse 79   Temp 99 F (37.2 C)   Ht 5\' 3"  (1.6 m)   Wt 126 lb (57.2 kg)   SpO2 98%   BMI 22.32 kg/m    Physical Exam Vitals reviewed.  Constitutional:      General: He is in acute distress.     Appearance: Normal appearance. He is normal weight. He is not ill-appearing, toxic-appearing or diaphoretic.  HENT:     Head: Normocephalic and atraumatic.     Right Ear: Tympanic membrane, ear canal and external ear normal.     Left Ear: Tympanic membrane, ear canal and external ear normal.     Nose: Congestion and rhinorrhea present.  Mouth/Throat:     Pharynx: Posterior oropharyngeal erythema present. No pharyngeal swelling.  Eyes:     General: No scleral icterus.       Right eye: No discharge.        Left eye: No discharge.     Extraocular Movements: Extraocular movements intact.     Conjunctiva/sclera: Conjunctivae normal.     Pupils: Pupils are equal, round, and reactive to light.  Cardiovascular:     Rate and Rhythm: Normal rate and regular rhythm.     Pulses: Normal pulses.     Heart sounds: Normal heart sounds. No murmur heard. Pulmonary:     Effort: Pulmonary effort is normal. No respiratory distress.     Breath sounds: Normal breath sounds. No wheezing or rhonchi.  Abdominal:     General: Abdomen is flat. Bowel sounds are normal.     Palpations: Abdomen is soft.  Musculoskeletal:        General: Normal range of motion.     Cervical back: Normal range of motion and neck supple.     Right lower leg: No  edema.     Left lower leg: No edema.  Lymphadenopathy:     Cervical: Cervical adenopathy present.  Skin:    General: Skin is warm and dry.     Findings: No rash.  Neurological:     General: No focal deficit present.     Mental Status: He is alert and oriented to person, place, and time. Mental status is at baseline.  Psychiatric:        Behavior: Behavior normal.        Thought Content: Thought content normal.      No results found for any visits on 12/14/22.  Assessment & Plan    1. Acute cough 2. Congestion of nasal sinus 3. Abnormal lung sounds 4. Other form of dyspnea Acute, x 5 days Hx of sick contact Normal vitals with slightly elevated body temperature. Advised to proceed with symptomatic treatment: Increase fluids. Drink warm/hot tea with honey. Rest.  Warm salt gargles. Saline nasal spray.  Mucinex or Tessalon as directed.  Humidifier in bedroom. Flonase OTC. NSAIDs and Tylenol for pain and fever control. Call or return to clinic if symptoms are not improving.  - benzonatate (TESSALON) 100 MG capsule; Take 1 capsule (100 mg total) by mouth 2 (two) times daily as needed for cough.  Dispense: 20 capsule; Refill: 0 - DG Chest 2 View; Future Will reassess after  receiving imaging results  No follow-ups on file.     The patient was advised to call back or seek an in-person evaluation if the symptoms worsen or if the condition fails to improve as anticipated.  I discussed the assessment and treatment plan with the patient. The patient was provided an opportunity to ask questions and all were answered. The patient agreed with the plan and demonstrated an understanding of the instructions.  I, Debera Lat, PA-C have reviewed all documentation for this visit. The documentation on  12/14/22  for the exam, diagnosis, procedures, and orders are all accurate and complete.  Debera Lat, [facility], MMS [facility] [phone] (phone) [phone] (fax)  [facility]

## 2022-12-16 NOTE — Progress Notes (Signed)
Please, let pt know that CXR was negative for acute cardiopulmonary disease

## 2022-12-16 NOTE — Telephone Encounter (Signed)
completed

## 2023-03-14 ENCOUNTER — Ambulatory Visit (INDEPENDENT_AMBULATORY_CARE_PROVIDER_SITE_OTHER): Payer: Medicare Other

## 2023-03-14 VITALS — Ht 63.0 in | Wt 123.0 lb

## 2023-03-14 DIAGNOSIS — Z0000 Encounter for general adult medical examination without abnormal findings: Secondary | ICD-10-CM

## 2023-03-14 NOTE — Progress Notes (Signed)
Subjective:   Bruce Moody is a 72 y.o. male who presents for Medicare Annual/Subsequent preventive examination.  Visit Complete: Virtual  I connected with  Tammy L Leichter on 03/14/23 by a audio enabled telemedicine application and verified that I am speaking with the correct person using two identifiers.  Patient Location: Home  Provider Location: Home Office  I discussed the limitations of evaluation and management by telemedicine. The patient expressed understanding and agreed to proceed.  Vital Signs: Unable to obtain new vitals due to this being a telehealth visit.  Patient Medicare AWV questionnaire was completed by the patient on 03/13/23; I have confirmed that all information answered by patient is correct and no changes since this date.  Review of Systems    Cardiac Risk Factors include: advanced age (>21men, >44 women);dyslipidemia;male gender    Objective:    Today's Vitals   03/14/23 1349  Weight: 123 lb (55.8 kg)  Height: 5\' 3"  (1.6 m)   Body mass index is 21.79 kg/m.     03/14/2023    2:03 PM 03/10/2022    1:01 PM 01/13/2022   10:46 AM 03/09/2021    4:15 PM 03/03/2021    8:56 AM 02/24/2021    8:23 AM 10/07/2020    8:57 AM  Advanced Directives  Does Patient Have a Medical Advance Directive? Yes No Yes Yes Yes Yes Yes  Type of Estate agent of [place];Living will  Healthcare Power of [place];Living will Living will Living will Living will Healthcare Power of [place];Living will  Does patient want to make changes to medical advance directive?   No - Patient declined  No - Patient declined  No - Patient declined  Copy of Healthcare Power of Attorney in Chart?       No - copy requested  Would patient like information on creating a medical advance directive?  No - Patient declined         Current Medications (verified) Outpatient Encounter Medications as of 03/14/2023  Medication Sig   acetaminophen (TYLENOL) 500 MG tablet Take 500-1,000 mg  by mouth every 6 (six) hours as needed for moderate pain.   aspirin EC 81 MG tablet Take 81 mg by mouth daily.   atorvastatin (LIPITOR) 80 MG tablet Take 1 tablet (80 mg total) by mouth daily.   carvedilol (COREG) 3.125 MG tablet Take 3.125 mg by mouth 2 (two) times daily with a meal.    clopidogrel (PLAVIX) 75 MG tablet Take 75 mg by mouth daily.    ezetimibe (ZETIA) 10 MG tablet Take 1 tablet by mouth daily.   nitroGLYCERIN (NITROSTAT) 0.4 MG SL tablet Place 1 tablet (0.4 mg total) under the tongue every 5 (five) minutes as needed for chest pain.   omeprazole (PRILOSEC) 40 MG capsule TAKE 1 CAPSULE BY MOUTH DAILY   ramipril (ALTACE) 2.5 MG capsule Take 1 capsule by mouth daily.   benzonatate (TESSALON) 100 MG capsule Take 1 capsule (100 mg total) by mouth 2 (two) times daily as needed for cough.   PRESCRIPTION MEDICATION Apply 1 application  topically 2 (two) times daily. FLUOROURACIL 5% + CALCIPOTRIENE 0.005% apply twice daily to forehead, nose, and left cheek for 4-5 days   No facility-administered encounter medications on file as of 03/14/2023.    Allergies (verified) Patient has no known allergies.   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   Coronary artery disease    Heart disease    History of kidney stones    Hypertension    Myocardial infarction (HCC) 1998   Past Surgical History:  Procedure Laterality Date   BASAL CELL CARCINOMA EXCISION     right eyelid   CARDIAC CATHETERIZATION     CATARACT EXTRACTION W/PHACO Right 10/07/2020   Procedure: CATARACT EXTRACTION PHACO AND INTRAOCULAR LENS PLACEMENT (IOC) RIGHT 8.57 00:43.5;  Surgeon: Galen Manila, MD;  Location: [facility];  Service: Ophthalmology;  Laterality: Right;   COLONOSCOPY  2004   CORONARY ANGIOPLASTY     CORONARY ARTERY BYPASS  GRAFT  1998   CORONARY ARTERY BYPASS GRAFT     3 vessels   HERNIA REPAIR Left 2008   inguinal   HERNIA REPAIR Right 07/05/2013   inguinal    JOINT REPLACEMENT  2017   LEFT HEART CATH AND CORS/GRAFTS ANGIOGRAPHY N/A 01/13/2022   Procedure: LEFT HEART CATH AND CORS/GRAFTS ANGIOGRAPHY;  Surgeon: Lamar Blinks, MD;  Location: [facility];  Service: Cardiovascular;  Laterality: N/A;   MOHS SURGERY  02/03/2022   ROTATOR CUFF REPAIR  2005   TONSILLECTOMY     TOTAL HIP ARTHROPLASTY Left 06/08/2016   Procedure: TOTAL HIP ARTHROPLASTY ANTERIOR APPROACH;  Surgeon: Kennedy Bucker, MD;  Location: [facility];  Service: Orthopedics;  Laterality: Left;   TOTAL HIP ARTHROPLASTY Right 03/03/2021   Procedure: TOTAL HIP ARTHROPLASTY ANTERIOR APPROACH;  Surgeon: Kennedy Bucker, MD;  Location: [facility];  Service: Orthopedics;  Laterality: Right;   Family History  Problem Relation Age of Onset   Lymphoma Father    Heart disease Mother    Dementia Mother    Arthritis Sister    Heart disease Sister    COPD Sister    Social History   Socioeconomic History   Marital status: Married    Spouse name: Not on file   Number of children: 1   Years of education: Not on file   Highest education level: Bachelor's degree (e.g., BA, AB, BS)  Occupational History   Not on file  Tobacco Use   Smoking status: Former    Current packs/day: 0.00    Average packs/day: 1 pack/day for 20.0 years (20.0 ttl pk-yrs)    Types: Cigarettes    Quit date: 12/16/1996    Years since quitting: 26.2   Smokeless tobacco: Never  Vaping Use   Vaping status: Never Used  Substance and Sexual Activity   Alcohol use: Yes    Alcohol/week: 5.0 - 6.0 standard drinks of alcohol    Comment: occasionally   Drug use: No   Sexual activity: Yes    Birth control/protection: Other-see comments    Comment: Vasectomy  Other Topics Concern   Not on file  Social History Narrative   Not on file   Social Determinants of Health    Financial Resource Strain: Low Risk  (03/13/2023)   Overall Financial Resource Strain (CARDIA)    Difficulty of Paying Living Expenses: Not hard at all  Food Insecurity: No Food Insecurity (03/13/2023)   Hunger Vital Sign    Worried About Running Out of Food in the Last Year: Never true    Ran Out of Food in the Last Year: Never true  Transportation Needs: No Transportation Needs (03/13/2023)   PRAPARE - Administrator, Civil Service (Medical): No  Lack of Transportation (Non-Medical): No  Physical Activity: Sufficiently Active (03/13/2023)   Exercise Vital Sign    Days of Exercise per Week: 7 days    Minutes of Exercise per Session: 30 min  Stress: No Stress Concern Present (03/13/2023)   [employer] of Occupational Health - Occupational Stress Questionnaire    Feeling of Stress : Only a little  Social Connections: Unknown (03/13/2023)   Social Connection and Isolation Panel [NHANES]    Frequency of Communication with Friends and Family: Once a week    Frequency of Social Gatherings with Friends and Family: More than three times a week    Attends Religious Services: Not on Marketing executive or Organizations: No    Attends Banker Meetings: Never    Marital Status: Married    Tobacco Counseling Counseling given: Not Answered   Clinical Intake:  Pre-visit preparation completed: Yes  Pain : No/denies pain    BMI - recorded: 21.79 Nutritional Status: BMI of 19-24  Normal Nutritional Risks: None Diabetes: No  How often do you need to have someone help you when you read instructions, pamphlets, or other written materials from your doctor or pharmacy?: 1 - Never  Interpreter Needed?: No  Comments: lives with wife Information entered by :: B.Johnchristopher Sarvis,LPN   Activities of Daily Living    03/13/2023    9:54 AM  In your present state of health, do you have any difficulty performing the following activities:  Hearing? 0  Vision?  0  Difficulty concentrating or making decisions? 0  Walking or climbing stairs? 0  Dressing or bathing? 0  Doing errands, shopping? 0  Preparing Food and eating ? N  Using the Toilet? N  In the past six months, have you accidently leaked urine? N  Do you have problems with loss of bowel control? N  Managing your Medications? N  Managing your Finances? N  Housekeeping or managing your Housekeeping? N    Patient Care Team: Malva Limes, MD as PCP - General (Family Medicine) Lamar Blinks, MD as Consulting Physician (Cardiology) Debbrah Alar, MD as Consulting Physician (Dermatology) Kennedy Bucker, MD as Consulting Physician (Orthopedic Surgery)  Indicate any recent Medical Services you may have received from other than [facility] providers in the past year (date may be approximate).     Assessment:   This is a routine wellness examination for Kaylon.  Hearing/Vision screen Hearing Screening - Comments:: Hearing test Vision Screening - Comments:: Adequate vision; only readers Dr Druscilla Brownie  Dietary issues and exercise activities discussed:     Goals Addressed             This Visit's Progress    DIET - EAT MORE FRUITS AND VEGETABLES   On track    DIET - INCREASE WATER INTAKE   On track    Recommend increasing water intake to 3 glasses or more a day.        Depression Screen    03/14/2023    1:53 PM 03/10/2022    1:00 PM 04/21/2020    8:22 AM 02/12/2020    9:04 AM 10/06/2018   10:44 AM 10/04/2017    1:08 PM  PHQ 2/9 Scores  PHQ - 2 Score 0 0 0 0 0 0  PHQ- 9 Score  0 2       Fall Risk    03/13/2023    9:54 AM 03/10/2022    1:02 PM 04/21/2020    8:23  AM 02/12/2020    9:08 AM 10/06/2018   10:44 AM  Fall Risk   Falls in the past year? 0 0 0 0 0  Number falls in past yr: 0 0  0   Injury with Fall? 0 0 0 0   Risk for fall due to : No Fall Risks No Fall Risks No Fall Risks    Follow up Education provided;Falls prevention discussed Falls evaluation completed  Falls evaluation completed      MEDICARE RISK AT HOME: Medicare Risk at Home Any stairs in or around the home?: Yes If so, are there any without handrails?: No Home free of loose throw rugs in walkways, pet beds, electrical cords, etc?: Yes Adequate lighting in your home to reduce risk of falls?: Yes Life alert?: No Use of a cane, walker or w/c?: No Grab bars in the bathroom?: No Shower chair or bench in shower?: No Elevated toilet seat or a handicapped toilet?: No  TIMED UP AND GO:  Was the test performed?  No    Cognitive Function:        03/14/2023    2:03 PM 03/10/2022    1:03 PM 02/12/2020    9:11 AM 10/06/2018   10:46 AM  6CIT Screen  What Year? 0 points 0 points 0 points 0 points  What month? 0 points 0 points 0 points 0 points  What time? 0 points 0 points 0 points 0 points  Count back from 20 0 points 0 points 0 points 0 points  Months in reverse 0 points 0 points 0 points 0 points  Repeat phrase 0 points 0 points 0 points 0 points  Total Score 0 points 0 points 0 points 0 points    Immunizations Immunization History  Administered Date(s) Administered   Fluad Quad(high Dose 65+) 04/25/2019, 04/23/2020, 05/05/2022   Influenza Split 05/12/2011, 05/18/2012   Influenza, High Dose Seasonal PF 06/05/2015, 05/20/2016, 05/04/2017, 05/03/2018   Influenza,inj,Quad PF,6+ Mos 05/16/2013, 05/16/2014   Moderna Sars-Covid-2 Vaccination 08/31/2019, 09/28/2019, 06/02/2020   Pneumococcal Conjugate-13 10/04/2017   Pneumococcal Polysaccharide-23 10/06/2018   Tdap 02/20/2014    TDAP status: Up to date  Flu Vaccine status: Up to date  Pneumococcal vaccine status: Up to date  Covid-19 vaccine status: Completed vaccines  Qualifies for Shingles Vaccine? Yes   Zostavax completed Yes   Shingrix Completed?: Yes  Screening Tests Health Maintenance  Topic Date Due   Zoster Vaccines- Shingrix (1 of 2) Never done   COVID-19 Vaccine (4 - 2023-24 season) 03/19/2022   INFLUENZA  VACCINE  02/17/2023   DTaP/Tdap/Td (2 - Td or Tdap) 02/21/2024   Medicare Annual Wellness (AWV)  03/13/2024   Colonoscopy  05/28/2024   Pneumonia Vaccine 68+ Years old  Completed   Hepatitis C Screening  Completed   HPV VACCINES  Aged Out    Health Maintenance  Health Maintenance Due  Topic Date Due   Zoster Vaccines- Shingrix (1 of 2) Never done   COVID-19 Vaccine (4 - 2023-24 season) 03/19/2022   INFLUENZA VACCINE  02/17/2023    Colorectal cancer screening: Type of screening: Colonoscopy. Completed yes. Repeat every 5-10 years  Lung Cancer Screening: (Low Dose CT Chest recommended if Age 80-80 years, 20 pack-year currently smoking OR have quit w/in 15years.) does not qualify.   Lung Cancer Screening Referral: no  Additional Screening:  Hepatitis C Screening: does not qualify; Completed yes  Vision Screening: Recommended annual ophthalmology exams for early detection of glaucoma and other disorders of the eye.  Is the patient up to date with their annual eye exam?  Yes  Who is the provider or what is the name of the office in which the patient attends annual eye exams? Dr Azucena Cecil If pt is not established with a provider, would they like to be referred to a provider to establish care? No .   Dental Screening: Recommended annual dental exams for proper oral hygiene  Diabetic Foot Exam: n/a  Community Resource Referral / Chronic Care Management: CRR required this visit?  No   CCM required this visit?  Appt scheduled with PCP    Plan:     I have personally reviewed and noted the following in the patient's chart:   Medical and social history Use of alcohol, tobacco or illicit drugs  Current medications and supplements including opioid prescriptions. Patient is not currently taking opioid prescriptions. Functional ability and status Nutritional status Physical activity Advanced directives List of other physicians Hospitalizations, surgeries, and ER visits in  previous 12 months Vitals Screenings to include cognitive, depression, and falls Referrals and appointments  In addition, I have reviewed and discussed with patient certain preventive protocols, quality metrics, and best practice recommendations. A written personalized care plan for preventive services as well as general preventive health recommendations were provided to patient.     Sue Lush, LPN   12/21/5407   After Visit Summary: (MyChart) Due to this being a telephonic visit, the after visit summary with patients personalized plan was offered to patient via MyChart   Nurse Notes: The patient states he is doing well. He does relay he has not had blood work done in a long time and wonders when needed. Pt has not had a CPE in years: appt made w/PCP for PE  April 29, 2023

## 2023-04-29 ENCOUNTER — Ambulatory Visit: Payer: Medicare Other | Admitting: Family Medicine

## 2023-04-29 VITALS — BP 119/72 | HR 68 | Ht 63.0 in | Wt 126.2 lb

## 2023-04-29 DIAGNOSIS — Z125 Encounter for screening for malignant neoplasm of prostate: Secondary | ICD-10-CM | POA: Diagnosis not present

## 2023-04-29 DIAGNOSIS — E782 Mixed hyperlipidemia: Secondary | ICD-10-CM | POA: Diagnosis not present

## 2023-04-29 DIAGNOSIS — I255 Ischemic cardiomyopathy: Secondary | ICD-10-CM

## 2023-04-29 DIAGNOSIS — I10 Essential (primary) hypertension: Secondary | ICD-10-CM | POA: Diagnosis not present

## 2023-04-29 NOTE — Progress Notes (Signed)
Established patient visit   Patient: Bruce Moody   DOB: 12-Sep-1950   72 y.o. Male  MRN: [id number] Visit Date: 04/29/2023  Today's healthcare provider: Mila Merry, MD   Chief Complaint  Patient presents with   Annual Exam   Subjective    Discussed the use of AI scribe software for clinical note transcription with the patient, who gave verbal consent to proceed.  History of Present Illness   The patient, with a history of cardiac disease, presents for a routine follow up. He reports no new complaints and overall good health. He has been under the care of a cardiologist, Dr. Gwen Pounds, who recently retired. During his last cardiology visit, it was noted that part of his heart was not receiving adequate blood flow. This was over a year ago and the patient continues to remain symptom-free.  The patient is currently on atorvastatin and Zetia for cholesterol management. He has no complaints of dyspnea or other respiratory symptoms. He has regular follow-ups with an ophthalmologist and a dermatologist, the latter due to a history of skin cancers.  The patient also reports a history of wax buildup in the right ear, for which he sees an ear doctor annually. He denies any hearing loss or tinnitus. He has had a mild case of shingles several years ago, which did not cause significant discomfort. He reports no issues with his current prescriptions.       Medications: Outpatient Medications Prior to Visit  Medication Sig   acetaminophen (TYLENOL) 500 MG tablet Take 500-1,000 mg by mouth every 6 (six) hours as needed for moderate pain.   aspirin EC 81 MG tablet Take 81 mg by mouth daily.   atorvastatin (LIPITOR) 80 MG tablet Take 1 tablet (80 mg total) by mouth daily.   carvedilol (COREG) 3.125 MG tablet Take 3.125 mg by mouth 2 (two) times daily with a meal.    clopidogrel (PLAVIX) 75 MG tablet Take 75 mg by mouth daily.    ezetimibe (ZETIA) 10 MG tablet Take 1 tablet by mouth daily.    nitroGLYCERIN (NITROSTAT) 0.4 MG SL tablet Place 1 tablet (0.4 mg total) under the tongue every 5 (five) minutes as needed for chest pain.   omeprazole (PRILOSEC) 40 MG capsule TAKE 1 CAPSULE BY MOUTH DAILY   ramipril (ALTACE) 2.5 MG capsule Take 1 capsule by mouth daily.   [DISCONTINUED] benzonatate (TESSALON) 100 MG capsule Take 1 capsule (100 mg total) by mouth 2 (two) times daily as needed for cough.   [DISCONTINUED] PRESCRIPTION MEDICATION Apply 1 application  topically 2 (two) times daily. FLUOROURACIL 5% + CALCIPOTRIENE 0.005% apply twice daily to forehead, nose, and left cheek for 4-5 days   No facility-administered medications prior to visit.   Review of Systems  Constitutional:  Negative for appetite change, chills and fever.  Respiratory:  Negative for chest tightness, shortness of breath and wheezing.   Cardiovascular:  Negative for chest pain and palpitations.  Gastrointestinal:  Negative for abdominal pain, nausea and vomiting.       Objective    BP 119/72 (BP Location: Left Arm, Patient Position: Sitting, Cuff Size: Normal)   Pulse 68   Ht 5\' 3"  (1.6 m)   Wt 126 lb 3.2 oz (57.2 kg)   SpO2 98%   BMI 22.36 kg/m   Physical Exam   General: Appearance:    Well developed, well nourished male in no acute distress  Eyes:    PERRL, conjunctiva/corneas clear, EOM's intact  Lungs:     Clear to auscultation bilaterally, respirations unlabored  Heart:    Normal heart rate. Normal rhythm. No murmurs, rubs, or gallops.    MS:   All extremities are intact.    Neurologic:   Awake, alert, oriented x 3. No apparent focal neurological defect.         Assessment & Plan        Coronary Artery Disease Asymptomatic with a history of decreased blood flow to part of the heart. Discussed potential need for bypass surgery with a surgeon at [facility] over a year ago, but decision was made to monitor due to lack of symptoms. -Continue current management and monitoring for symptoms.  Continue routine follow up cardiology.   Hyperlipidemia On Atorvastatin and Zetia. Last LDL was in the 80s, but goal is to lower it to 50-60 due to history of heart disease. No recent labs since starting Zetia. -Order direct LDL measurement to assess efficacy of current regimen.  Preventive Health Up to date with cardiology, ophthalmology, and dermatology visits. No flu shot or shingles vaccine yet this year. -Recommend getting flu and COVID vaccines next week as planned. -Recommend considering shingles vaccine in a few months.  Follow-up -Check labs for cholesterol management. -Continue regular follow-ups with specialists. -Consider shingles vaccine in a few months.    No follow-ups on file.      Mila Merry, MD  [facility] [phone] (phone) [phone] (fax)  [facility]

## 2023-04-29 NOTE — Patient Instructions (Signed)
Please review the attached list of medications and notify my office if there are any errors.   The CDC recommends two doses of Shingrix (the shingles vaccine) separated by 2 to 6 months for adults age 74 years and older. I recommend checking with your insurance plan regarding coverage for this vaccine.   

## 2023-04-30 LAB — CBC
Hematocrit: 45.1 % (ref 37.5–51.0)
Hemoglobin: 14.6 g/dL (ref 13.0–17.7)
MCH: 32.3 pg (ref 26.6–33.0)
MCHC: 32.4 g/dL (ref 31.5–35.7)
MCV: 100 fL — ABNORMAL HIGH (ref 79–97)
Platelets: 262 10*3/uL (ref 150–450)
RBC: 4.52 x10E6/uL (ref 4.14–5.80)
RDW: 11.8 % (ref 11.6–15.4)
WBC: 7.7 10*3/uL (ref 3.4–10.8)

## 2023-04-30 LAB — COMPREHENSIVE METABOLIC PANEL
ALT: 23 [IU]/L (ref 0–44)
AST: 32 [IU]/L (ref 0–40)
Albumin: 4.5 g/dL (ref 3.8–4.8)
Alkaline Phosphatase: 91 [IU]/L (ref 44–121)
BUN/Creatinine Ratio: 13 (ref 10–24)
BUN: 12 mg/dL (ref 8–27)
Bilirubin Total: 0.7 mg/dL (ref 0.0–1.2)
CO2: 24 mmol/L (ref 20–29)
Calcium: 9.4 mg/dL (ref 8.6–10.2)
Chloride: 101 mmol/L (ref 96–106)
Creatinine, Ser: 0.94 mg/dL (ref 0.76–1.27)
Globulin, Total: 2.4 g/dL (ref 1.5–4.5)
Glucose: 79 mg/dL (ref 70–99)
Potassium: 4.5 mmol/L (ref 3.5–5.2)
Sodium: 139 mmol/L (ref 134–144)
Total Protein: 6.9 g/dL (ref 6.0–8.5)
eGFR: 85 mL/min/{1.73_m2} (ref >59)

## 2023-04-30 LAB — PSA TOTAL (REFLEX TO FREE): Prostate Specific Ag, Serum: 2.3 ng/mL (ref 0.0–4.0)

## 2023-04-30 LAB — LIPID PANEL
Chol/HDL Ratio: 2.3 {ratio} (ref 0.0–5.0)
Cholesterol, Total: 133 mg/dL (ref 100–199)
HDL: 58 mg/dL (ref >39)
LDL Chol Calc (NIH): 57 mg/dL (ref 0–99)
Triglycerides: 98 mg/dL (ref 0–149)
VLDL Cholesterol Cal: 18 mg/dL (ref 5–40)

## 2023-04-30 LAB — TSH: TSH: 1.68 u[IU]/mL (ref 0.450–4.500)

## 2023-04-30 LAB — LDL CHOLESTEROL, DIRECT: LDL Direct: 58 mg/dL (ref 0–99)

## 2023-06-18 IMAGING — XA DG HIP (WITH PELVIS) OPERATIVE*R*
3 series · 3 of 3 positions shown · non-contrast
Comparison: None

FLUOROSCOPY TIME:  0 minutes 24 seconds

Dose: 6.2 mGy

CLINICAL DATA: RIGHT hip replacement surgery

EXAM:
OPERATIVE RIGHT HIP (WITH PELVIS IF PERFORMED) 3 VIEWS
TECHNIQUE: Fluoroscopic spot image(s) were submitted for interpretation
post-operatively.

[Series 2: ortho standard · 1 of 1 slices shown (1 of 3)]
[im 1/1]
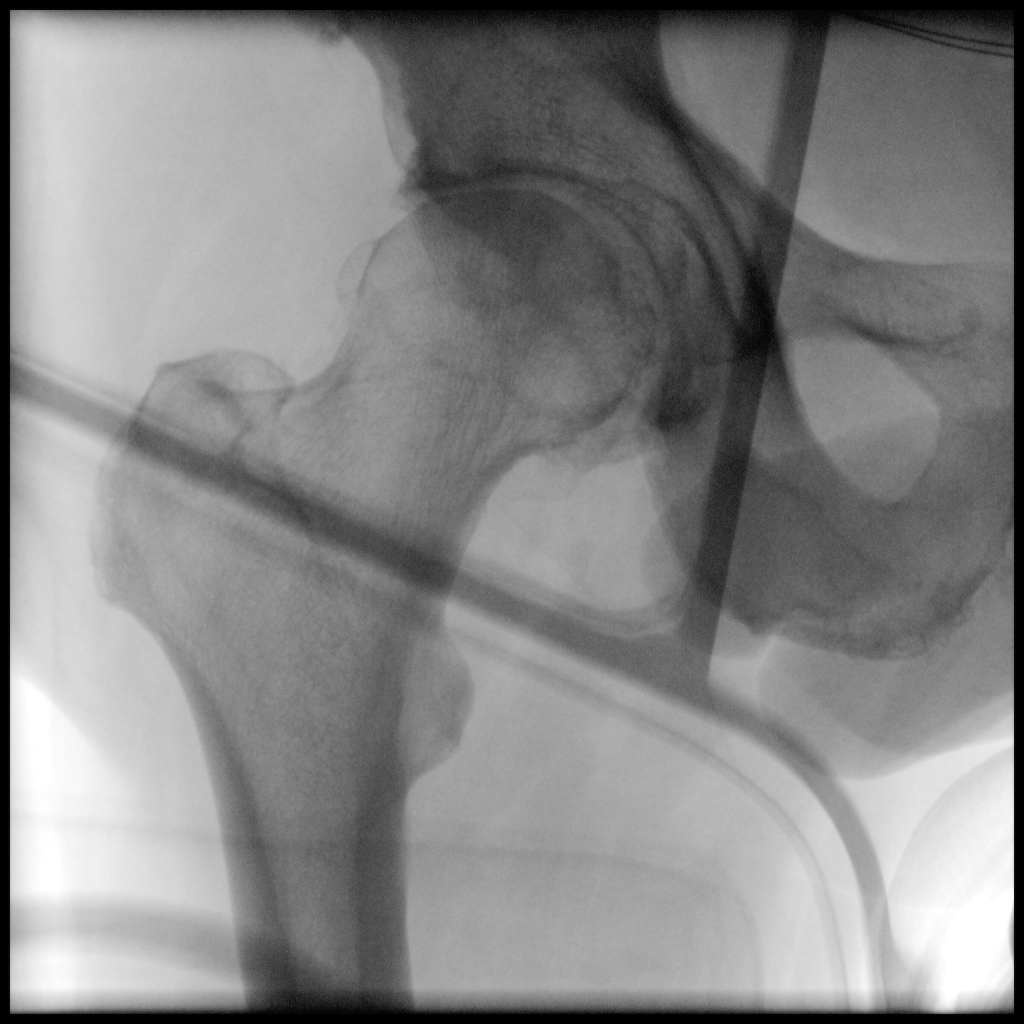

[Series 10: ortho standard · 1 of 1 slices shown (2 of 3)]
[im 1/1]
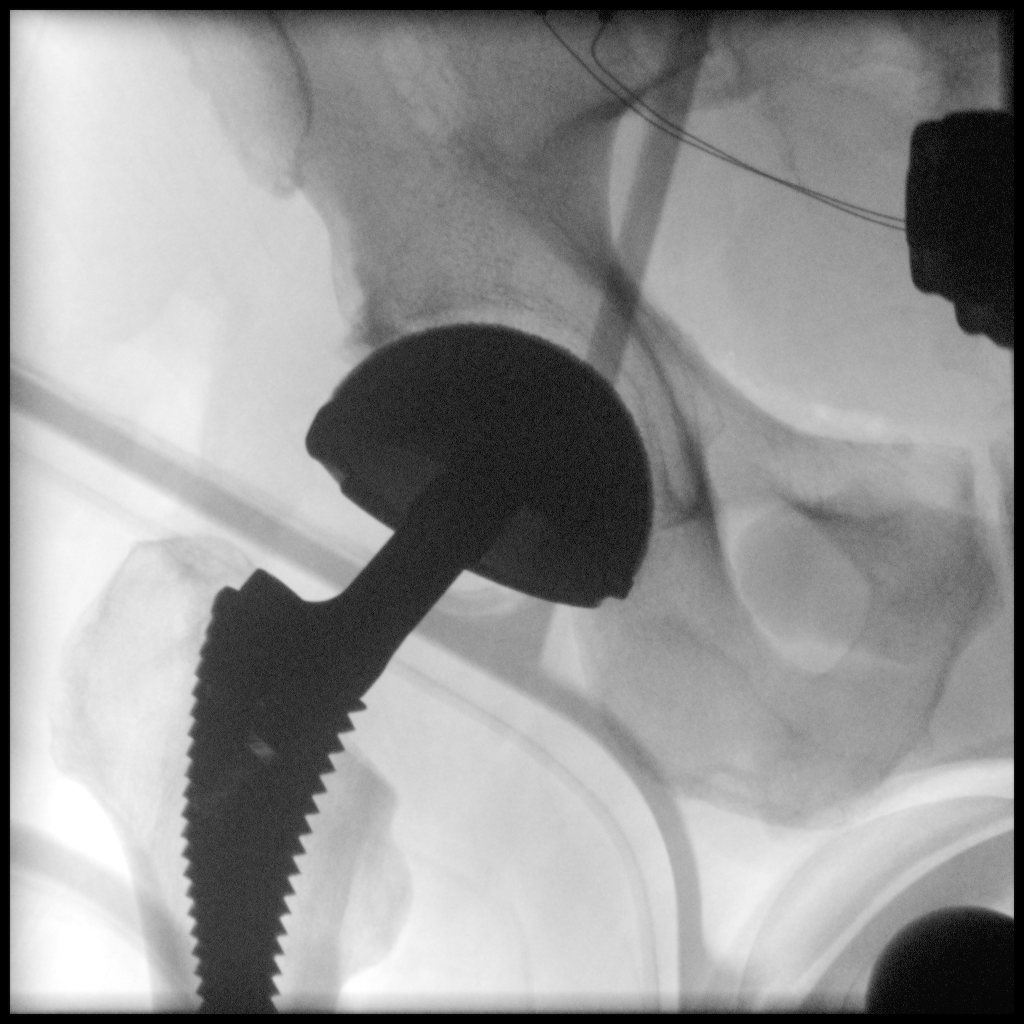

[Series 11: ortho standard · 1 of 1 slices shown (3 of 3)]
[im 1/1]
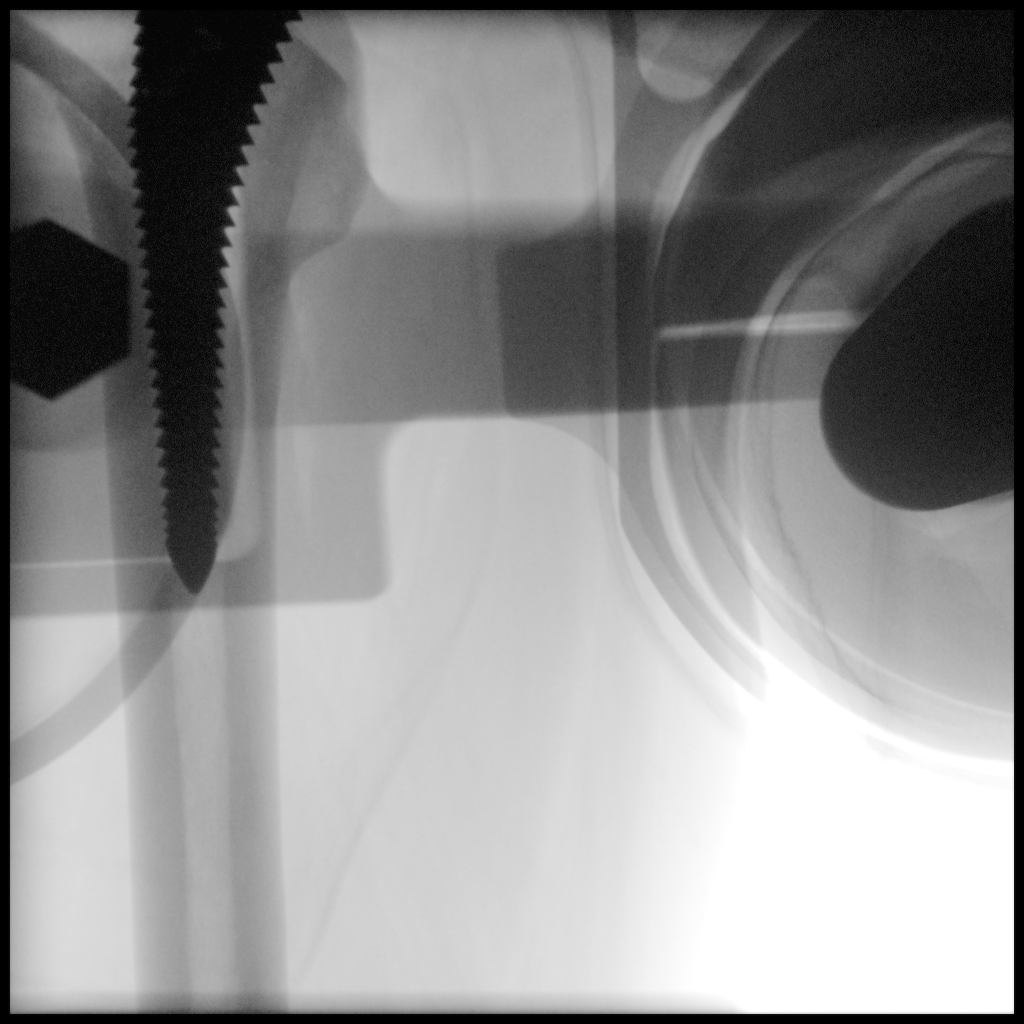

[3 of 3 positions shown; findings below may reference images not displayed]

FINDINGS: Initial image demonstrates osteoarthritic changes of the RIGHT hip
joint with joint space narrowing and spur formation.

Subsequent images obtained during placement of a RIGHT hip
prosthesis
IMPRESSION: RIGHT hip replacement surgery for osteoarthritis RIGHT hip.

## 2023-06-18 IMAGING — DX DG HIP (WITH OR WITHOUT PELVIS) 2-3V*R*
2 series · 2 of 2 positions shown · non-contrast
Comparison: None.

CLINICAL DATA: Postop right hip

EXAM:
DG HIP (WITH OR WITHOUT PELVIS) 2-3V RIGHT

[hip ap]
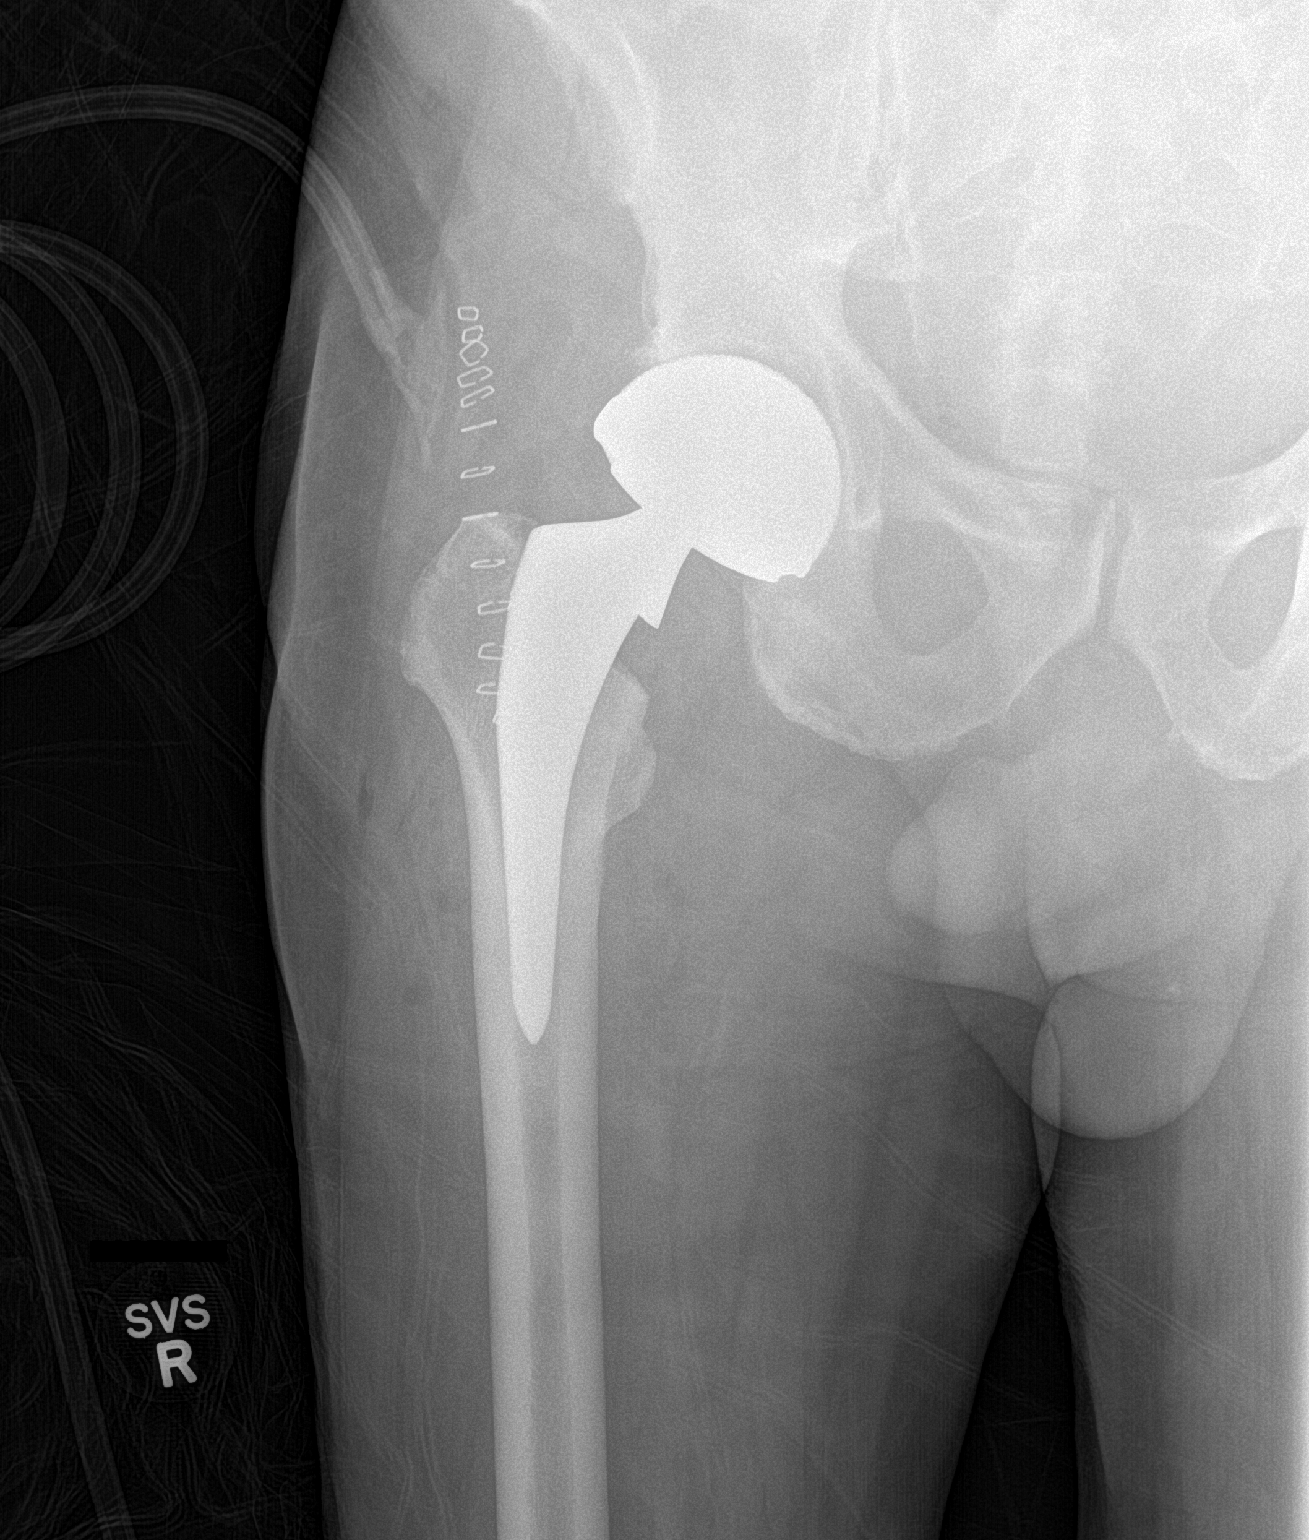

[hip lat]
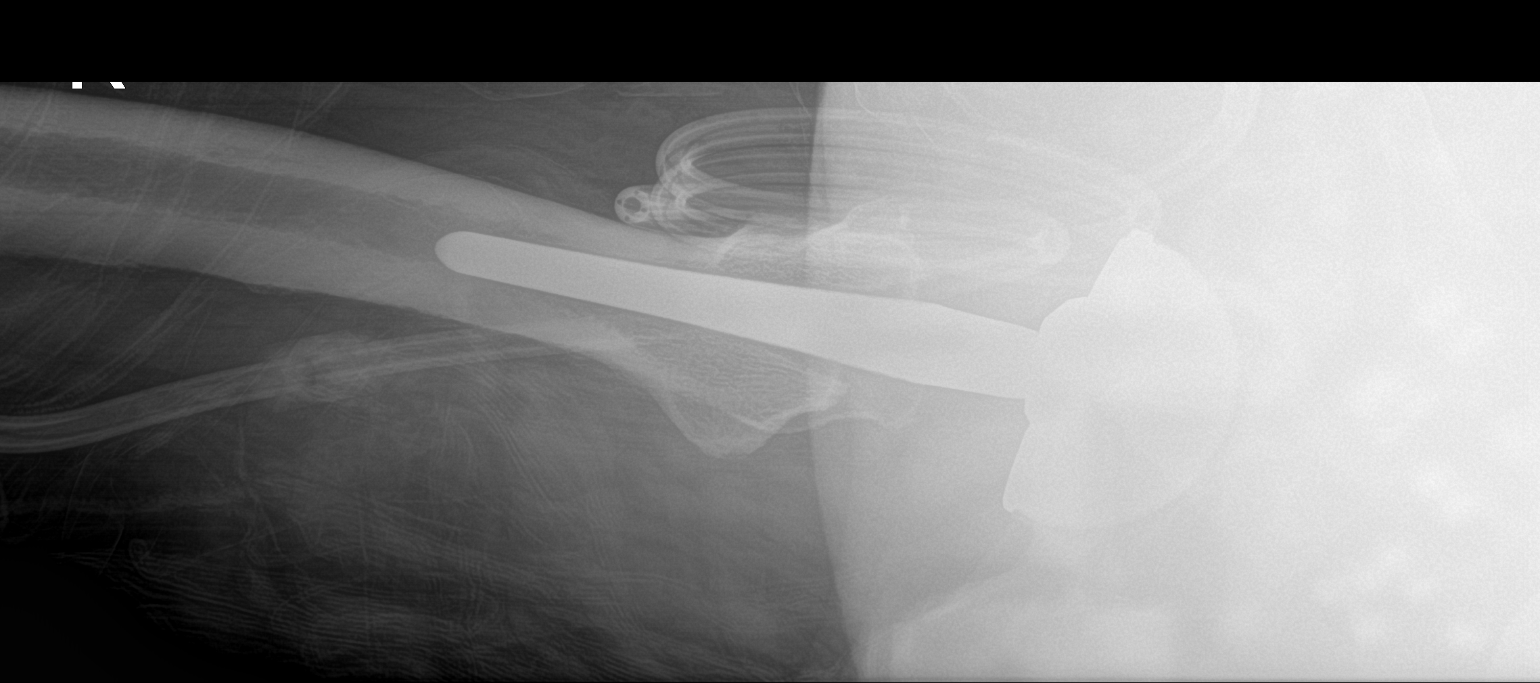

[2 of 2 positions shown; findings below may reference images not displayed]

FINDINGS: Interval postsurgical changes from right total hip arthroplasty.
Arthroplasty components appear in their expected alignment. No
periprosthetic fracture is identified. Expected postoperative
changes within the overlying soft tissues.
IMPRESSION: Expected postsurgical changes of right total hip arthroplasty.

## 2023-06-25 IMAGING — US US EXTREM LOW VENOUS*R*
1 series · 14 of 24 positions shown · non-contrast
Comparison: Hip radiographs, 03/09/2021.

CLINICAL DATA: Leg swelling x1 week.

EXAM:
RIGHT LOWER EXTREMITY VENOUS DOPPLER ULTRASOUND
TECHNIQUE: Gray-scale sonography with compression, as well as color and duplex
ultrasound, were performed to evaluate the deep venous system(s)
from the level of the common femoral vein through the popliteal and
proximal calf veins.

[Series 1: us extrem low venous*right* · 14 of 37 slices shown]
[im 1/37]
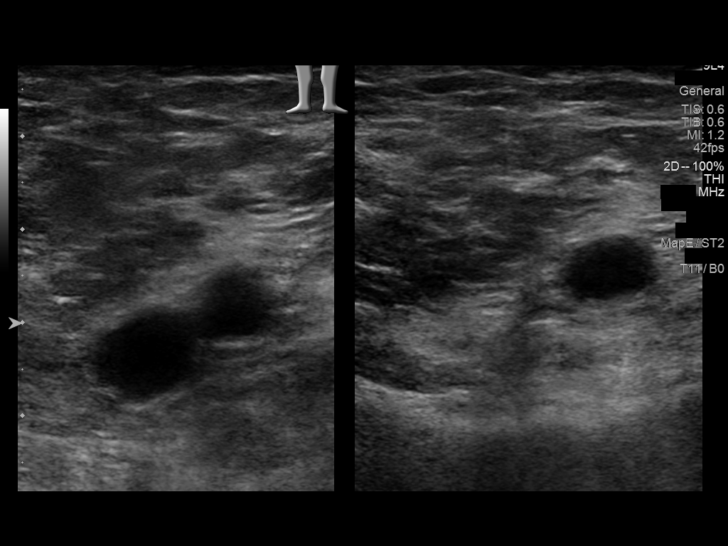
[im 4/37]
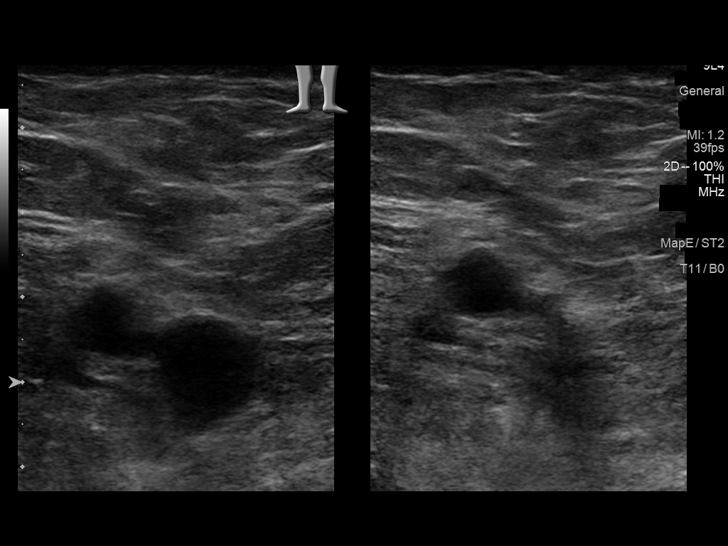
[im 7/37]
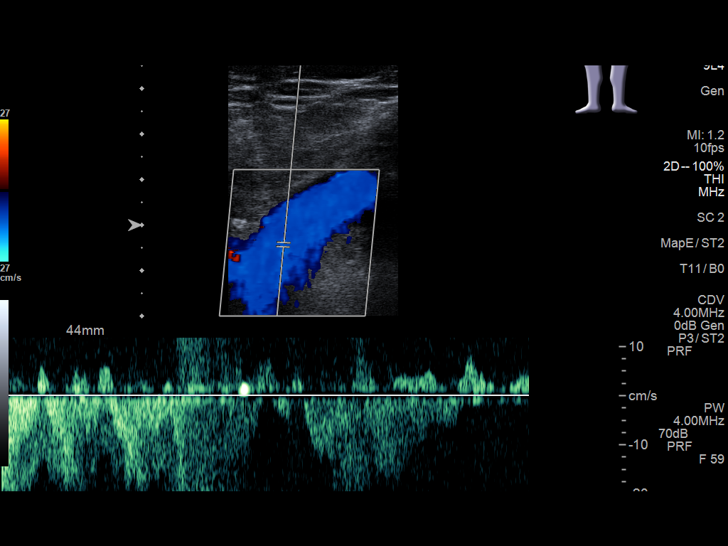
[im 10/37]
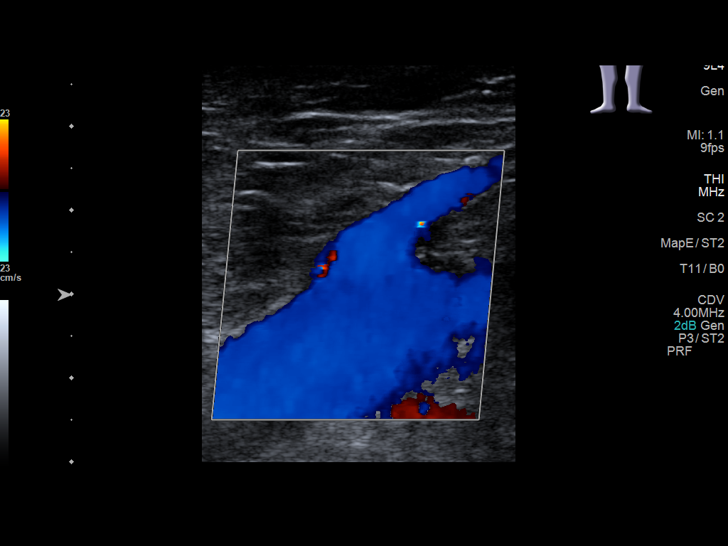
[im 11/37]
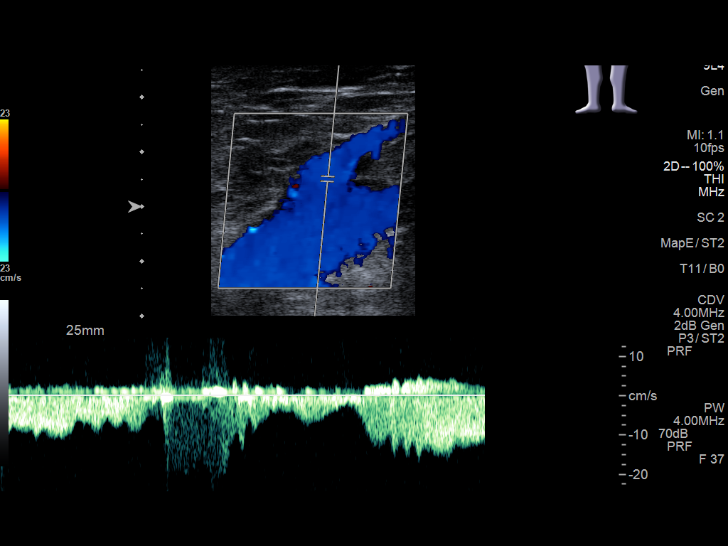
[im 15/37]
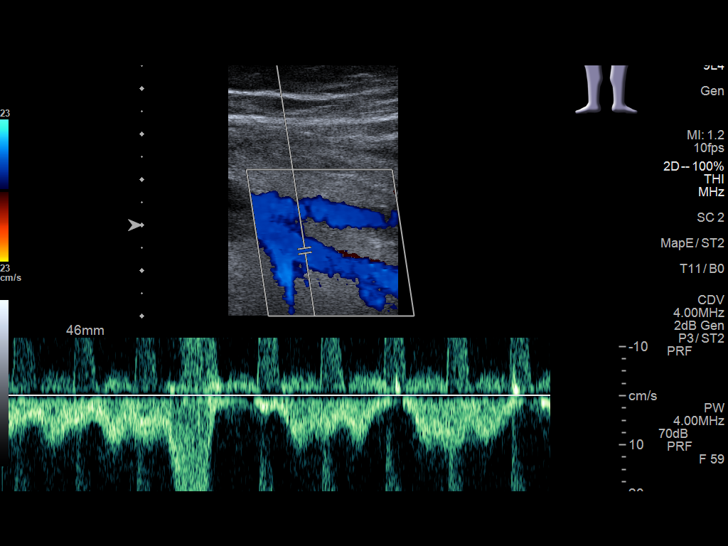
[im 18/37]
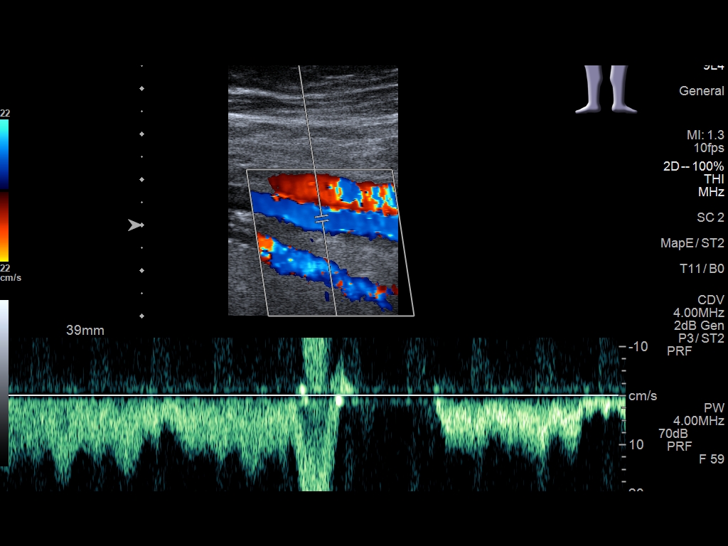
[im 19/37]
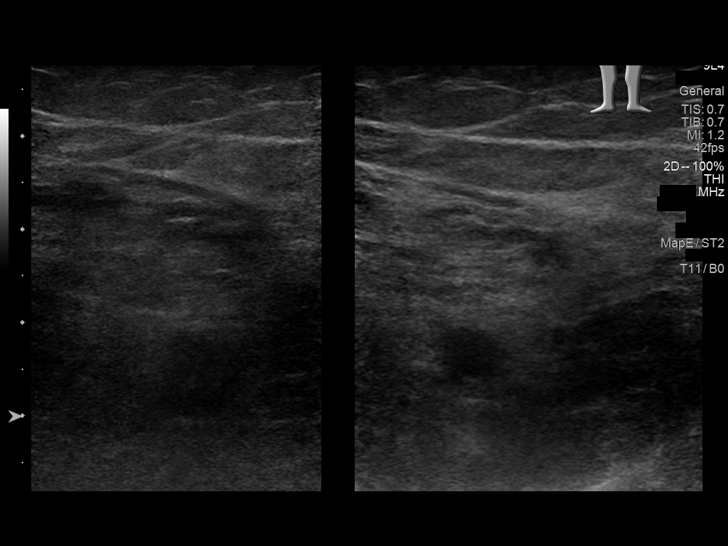
[im 22/37]
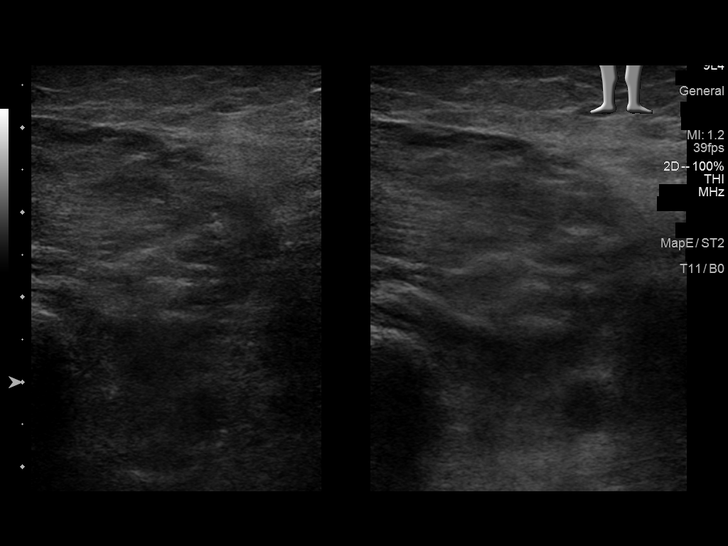
[im 26/37]
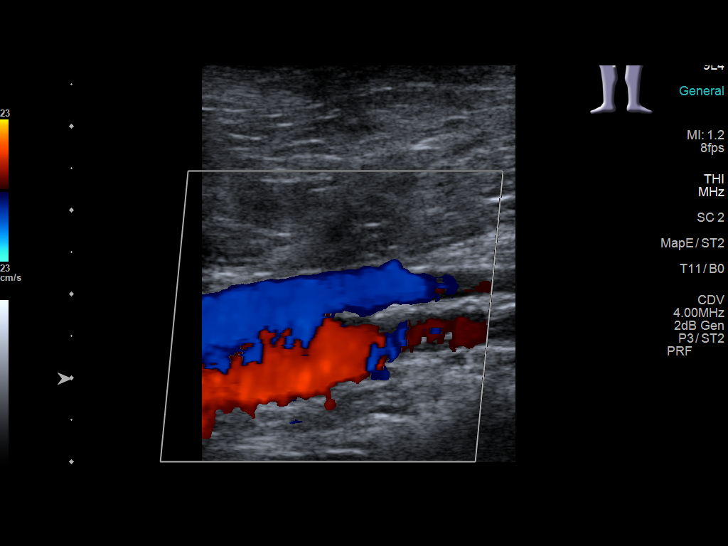
[im 29/37]
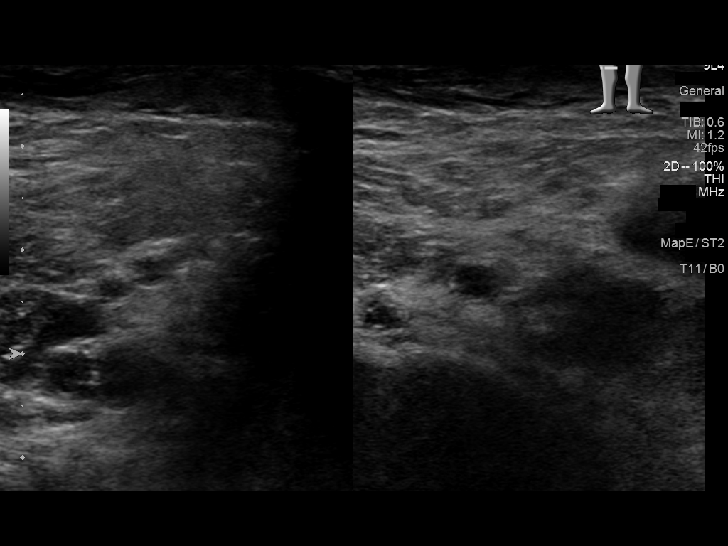
[im 30/37]
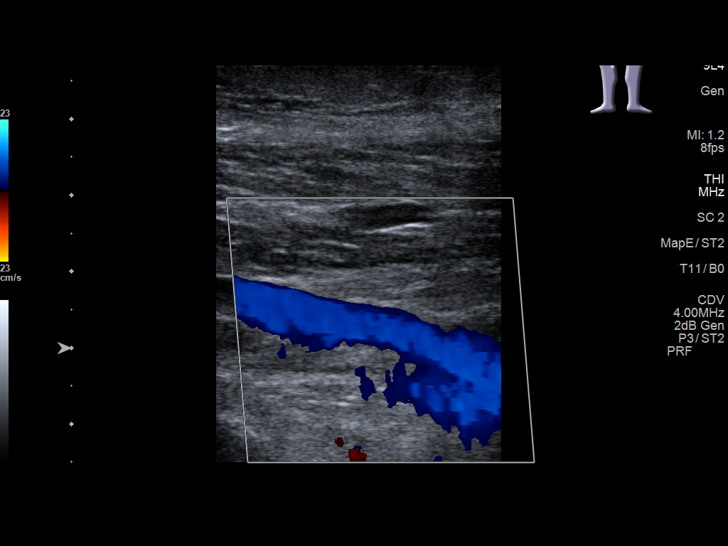
[im 33/37]
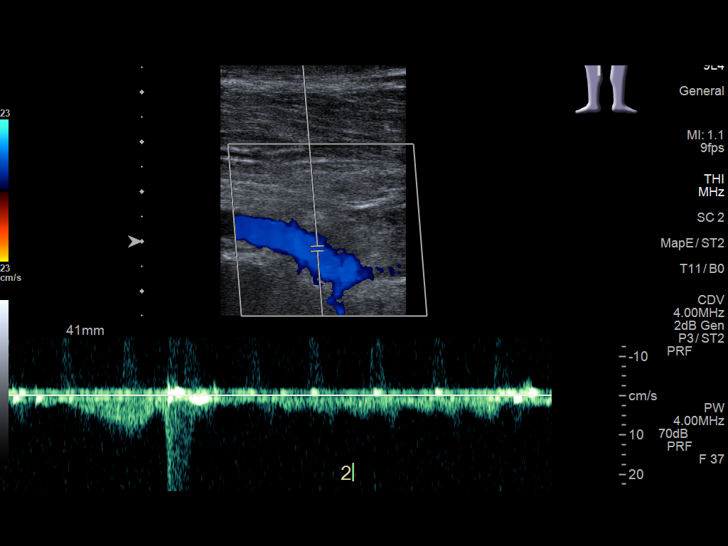
[im 37/37]
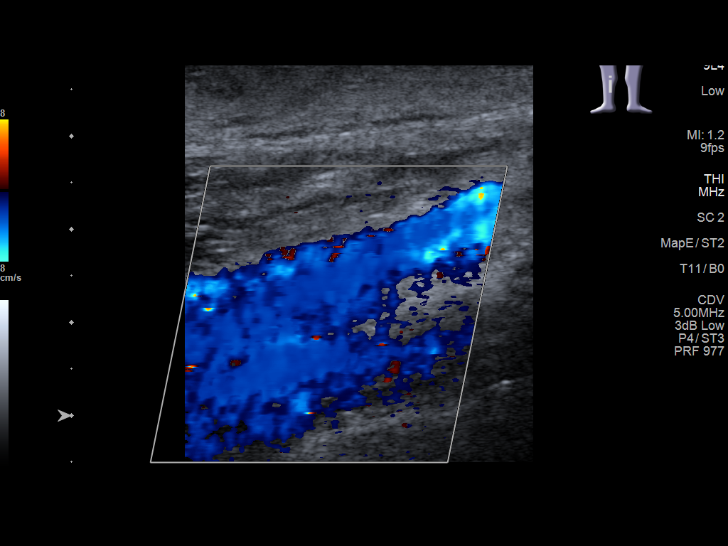

[14 of 24 positions shown; findings below may reference images not displayed]

FINDINGS: VENOUS

Normal compressibility of the common femoral, superficial femoral,
and popliteal veins, as well as the visualized calf veins.
Visualized portions of profunda femoral vein and great saphenous
vein unremarkable. No filling defects to suggest DVT on grayscale or
color Doppler imaging. Doppler waveforms show normal direction of
venous flow, normal respiratory plasticity and response to
augmentation.

Limited views of the contralateral common femoral vein are
unremarkable.

OTHER

No evidence of superficial thrombophlebitis or abnormal fluid
collection.

Limitations: none
IMPRESSION: No femoropopliteal DVT within the RIGHT lower extremity.

## 2023-11-05 ENCOUNTER — Other Ambulatory Visit: Payer: Self-pay | Admitting: Family Medicine

## 2023-11-05 DIAGNOSIS — R079 Chest pain, unspecified: Secondary | ICD-10-CM

## 2024-03-14 ENCOUNTER — Ambulatory Visit: Payer: Medicare Other

## 2024-03-14 DIAGNOSIS — Z0000 Encounter for general adult medical examination without abnormal findings: Secondary | ICD-10-CM

## 2024-03-14 NOTE — Progress Notes (Signed)
 Subjective:   Bruce Moody is a 73 y.o. who presents for a Medicare Wellness preventive visit.  As a reminder, Annual Wellness Visits don't include a physical exam, and some assessments may be limited, especially if this visit is performed virtually. We may recommend an in-person follow-up visit with your provider if needed.  Visit Complete: Virtual I connected with  Bruce Moody on 03/14/24 by a audio enabled telemedicine application and verified that I am speaking with the correct person using two identifiers.  Patient Location: Home  Provider Location: Home Office  I discussed the limitations of evaluation and management by telemedicine. The patient expressed understanding and agreed to proceed.  Vital Signs: Because this visit was a virtual/telehealth visit, some criteria may be missing or patient reported. Any vitals not documented were not able to be obtained and vitals that have been documented are patient reported.  VideoDeclined- This patient declined Librarian, academic. Therefore the visit was completed with audio only.  Persons Participating in Visit: Patient.  AWV Questionnaire: No: Patient Medicare AWV questionnaire was not completed prior to this visit.  Cardiac Risk Factors include: advanced age (>3men, >58 women);dyslipidemia;hypertension;male gender     Objective:    There were no vitals filed for this visit. There is no height or weight on file to calculate BMI.     03/14/2024    1:10 PM 03/14/2023    2:03 PM 03/10/2022    1:01 PM 01/13/2022   10:46 AM 03/09/2021    4:15 PM 03/03/2021    8:56 AM 02/24/2021    8:23 AM  Advanced Directives  Does Patient Have a Medical Advance Directive? No Yes No Yes Yes Yes Yes  Type of Furniture conservator/restorer;Living will  Healthcare Power of [place];Living will Living will Living will Living will  Does patient want to make changes to medical advance directive?    No -  Patient declined  No - Patient declined   Would patient like information on creating a medical advance directive? No - Patient declined  No - Patient declined        Current Medications (verified) Outpatient Encounter Medications as of 03/14/2024  Medication Sig   acetaminophen  (TYLENOL ) 500 MG tablet Take 500-1,000 mg by mouth every 6 (six) hours as needed for moderate pain.   aspirin  EC 81 MG tablet Take 81 mg by mouth daily.   atorvastatin  (LIPITOR) 80 MG tablet Take 1 tablet (80 mg total) by mouth daily.   carvedilol  (COREG ) 3.125 MG tablet Take 3.125 mg by mouth 2 (two) times daily with a meal.    clopidogrel  (PLAVIX ) 75 MG tablet Take 75 mg by mouth daily.    ezetimibe (ZETIA) 10 MG tablet Take 1 tablet by mouth daily.   nitroGLYCERIN  (NITROSTAT ) 0.4 MG SL tablet Place 1 tablet (0.4 mg total) under the tongue every 5 (five) minutes as needed for chest pain.   omeprazole  (PRILOSEC) 40 MG capsule TAKE 1 CAPSULE BY MOUTH DAILY   ramipril  (ALTACE ) 2.5 MG capsule Take 1 capsule by mouth daily.   No facility-administered encounter medications on file as of 03/14/2024.    Allergies (verified) Patient has no known allergies.   History: Past Medical History:  Diagnosis Date   Arthritis    right hip   Basal cell carcinoma (BCC) of nasal tip    excised Isenstein 01/2022   Cancer (HCC) 2011   Melanoma pn forehead 2017 and upper back 2011   Coronary  artery disease    Heart disease    History of kidney stones    Hypertension    Myocardial infarction (HCC) 1998   Past Surgical History:  Procedure Laterality Date   BASAL CELL CARCINOMA EXCISION     right eyelid   CARDIAC CATHETERIZATION     CATARACT EXTRACTION W/PHACO Right 10/07/2020   Procedure: CATARACT EXTRACTION PHACO AND INTRAOCULAR LENS PLACEMENT (IOC) RIGHT 8.57 00:43.5;  Surgeon: Jaye Fallow, MD;  Location: [facility];  Service: Ophthalmology;  Laterality: Right;   COLONOSCOPY  2004   CORONARY ANGIOPLASTY      CORONARY ARTERY BYPASS GRAFT  1998   CORONARY ARTERY BYPASS GRAFT     3 vessels   HERNIA REPAIR Left 2008   inguinal   HERNIA REPAIR Right 07/05/2013   inguinal    JOINT REPLACEMENT  2017   LEFT HEART CATH AND CORS/GRAFTS ANGIOGRAPHY N/A 01/13/2022   Procedure: LEFT HEART CATH AND CORS/GRAFTS ANGIOGRAPHY;  Surgeon: Hester Wolm PARAS, MD;  Location: [facility];  Service: Cardiovascular;  Laterality: N/A;   MOHS SURGERY  02/03/2022   ROTATOR CUFF REPAIR  2005   TONSILLECTOMY     TOTAL HIP ARTHROPLASTY Left 06/08/2016   Procedure: TOTAL HIP ARTHROPLASTY ANTERIOR APPROACH;  Surgeon: Ozell Flake, MD;  Location: [facility];  Service: Orthopedics;  Laterality: Left;   TOTAL HIP ARTHROPLASTY Right 03/03/2021   Procedure: TOTAL HIP ARTHROPLASTY ANTERIOR APPROACH;  Surgeon: Flake Ozell, MD;  Location: [facility];  Service: Orthopedics;  Laterality: Right;   Family History  Problem Relation Age of Onset   Lymphoma Father    Heart disease Mother    Dementia Mother    Arthritis Sister    Heart disease Sister    COPD Sister    Social History   Socioeconomic History   Marital status: Married    Spouse name: Not on file   Number of children: 1   Years of education: Not on file   Highest education level: Bachelor's degree (e.g., BA, AB, BS)  Occupational History   Not on file  Tobacco Use   Smoking status: Former    Current packs/day: 0.00    Average packs/day: 1 pack/day for 20.0 years (20.0 ttl pk-yrs)    Types: Cigarettes    Quit date: 12/16/1996    Years since quitting: 27.2   Smokeless tobacco: Never  Vaping Use   Vaping status: Never Used  Substance and Sexual Activity   Alcohol use: Yes    Alcohol/week: 5.0 - 6.0 standard drinks of alcohol    Comment: occasionally   Drug use: No   Sexual activity: Yes    Birth control/protection: Other-see comments    Comment: Vasectomy  Other Topics Concern   Not on file  Social History Narrative   Not on file   Social  Drivers of Health   Financial Resource Strain: Low Risk  (03/14/2024)   Overall Financial Resource Strain (CARDIA)    Difficulty of Paying Living Expenses: Not hard at all  Food Insecurity: No Food Insecurity (03/14/2024)   Hunger Vital Sign    Worried About Running Out of Food in the Last Year: Never true    Ran Out of Food in the Last Year: Never true  Transportation Needs: No Transportation Needs (03/14/2024)   PRAPARE - Administrator, Civil Service (Medical): No    Lack of Transportation (Non-Medical): No  Physical Activity: Sufficiently Active (03/14/2024)   Exercise Vital Sign    Days  of Exercise per Week: 5 days    Minutes of Exercise per Session: 30 min  Stress: No Stress Concern Present (03/14/2024)   [employer] of Occupational Health - Occupational Stress Questionnaire    Feeling of Stress: Not at all  Social Connections: Unknown (03/14/2024)   Social Connection and Isolation Panel    Frequency of Communication with Friends and Family: Patient declined    Frequency of Social Gatherings with Friends and Family: Patient declined    Attends Religious Services: Patient declined    Database administrator or Organizations: No    Attends Engineer, structural: Never    Marital Status: Married    Tobacco Counseling Counseling given: Not Answered    Clinical Intake:  Pre-visit preparation completed: Yes  Pain : No/denies pain     BMI - recorded: 22.3 Nutritional Status: BMI of 19-24  Normal Nutritional Risks: None Diabetes: No  Lab Results  Component Value Date   HGBA1C 5.7 (H) 11/06/2021     How often do you need to have someone help you when you read instructions, pamphlets, or other written materials from your doctor or pharmacy?: 1 - Never  Interpreter Needed?: No  Information entered by :: JHONNIE DAS, LPN   Activities of Daily Living    03/14/2024    1:11 PM 03/12/2024    1:57 PM  In your present state of health, do you have  any difficulty performing the following activities:  Hearing? 0 0  Vision? 0 0  Difficulty concentrating or making decisions? 0 0  Walking or climbing stairs? 0 0  Dressing or bathing? 0 0  Doing errands, shopping? 0 0  Preparing Food and eating ? N N  Using the Toilet? N N  In the past six months, have you accidently leaked urine? N N  Do you have problems with loss of bowel control? N N  Managing your Medications? N N  Managing your Finances? N N  Housekeeping or managing your Housekeeping? N N    Patient Care Team: Gasper Nancyann BRAVO, MD as PCP - General (Family Medicine) Hester Wolm PARAS, MD as Consulting Physician (Cardiology) Isenstein, Arin L, MD as Consulting Physician (Dermatology) Kathlynn Sharper, MD as Consulting Physician (Orthopedic Surgery) Jaye Fallow, MD as Referring Physician (Ophthalmology)  I have updated your Care Teams any recent Medical Services you may have received from other providers in the past year.     Assessment:   This is a routine wellness examination for Bruce Moody.  Hearing/Vision screen Hearing Screening - Comments:: NO AIDS Vision Screening - Comments:: [facility]- DR.PORFILIO- LAST VISIT IN NOV/DEC 2024   Goals Addressed             This Visit's Progress    Cut out extra servings         Depression Screen     03/14/2024    1:09 PM 04/29/2023    1:56 PM 03/14/2023    1:53 PM 03/10/2022    1:00 PM 04/21/2020    8:22 AM 02/12/2020    9:04 AM 10/06/2018   10:44 AM  PHQ 2/9 Scores  PHQ - 2 Score 0 0 0 0 0 0 0  PHQ- 9 Score 0 2  0 2      Fall Risk     03/12/2024    1:57 PM 03/13/2023    9:54 AM 03/10/2022    1:02 PM 04/21/2020    8:23 AM 02/12/2020    9:08 AM  Fall Risk  Falls in the past year? 0 0 0 0 0  Number falls in past yr: 0 0 0  0  Injury with Fall? 0 0 0 0 0  Risk for fall due to : No Fall Risks No Fall Risks No Fall Risks No Fall Risks   Follow up Falls evaluation completed;Falls prevention discussed Education  provided;Falls prevention discussed Falls evaluation completed  Falls evaluation completed       Data saved with a previous flowsheet row definition    MEDICARE RISK AT HOME:  Medicare Risk at Home Any stairs in or around the home?: Yes If so, are there any without handrails?: No Home free of loose throw rugs in walkways, pet beds, electrical cords, etc?: Yes Adequate lighting in your home to reduce risk of falls?: Yes Life alert?: No Use of a cane, walker or w/c?: No Grab bars in the bathroom?: No Shower chair or bench in shower?: No Elevated toilet seat or a handicapped toilet?: No  TIMED UP AND GO:  Was the test performed?  No  Cognitive Function: 6CIT completed        03/14/2024    1:12 PM 03/14/2023    2:03 PM 03/10/2022    1:03 PM 02/12/2020    9:11 AM 10/06/2018   10:46 AM  6CIT Screen  What Year? 0 points 0 points 0 points 0 points 0 points  What month? 3 points 0 points 0 points 0 points 0 points  What time? 0 points 0 points 0 points 0 points 0 points  Count back from 20 0 points 0 points 0 points 0 points 0 points  Months in reverse 0 points 0 points 0 points 0 points 0 points  Repeat phrase 0 points 0 points 0 points 0 points 0 points  Total Score 3 points 0 points 0 points 0 points 0 points    Immunizations Immunization History  Administered Date(s) Administered   Fluad Quad(high Dose 65+) 04/25/2019, 04/23/2020, 05/05/2022   INFLUENZA, HIGH DOSE SEASONAL PF 06/05/2015, 05/20/2016, 05/04/2017, 05/03/2018   Influenza Split 05/12/2011, 05/18/2012   Influenza,inj,Quad PF,6+ Mos 05/16/2013, 05/16/2014   Moderna Sars-Covid-2 Vaccination 08/31/2019, 09/28/2019, 06/02/2020   Pneumococcal Conjugate-13 10/04/2017   Pneumococcal Polysaccharide-23 10/06/2018   Tdap 02/20/2014    Screening Tests Health Maintenance  Topic Date Due   Zoster Vaccines- Shingrix (1 of 2) Never done   COVID-19 Vaccine (4 - 2024-25 season) 03/20/2023   DTaP/Tdap/Td (2 - Td or Tdap)  02/21/2024   INFLUENZA VACCINE  02/17/2024   Colonoscopy  05/28/2024   Medicare Annual Wellness (AWV)  03/14/2025   Pneumococcal Vaccine: 50+ Years  Completed   Hepatitis C Screening  Completed   HPV VACCINES  Aged Out   Meningococcal B Vaccine  Aged Out    Health Maintenance  Health Maintenance Due  Topic Date Due   Zoster Vaccines- Shingrix (1 of 2) Never done   COVID-19 Vaccine (4 - 2024-25 season) 03/20/2023   DTaP/Tdap/Td (2 - Td or Tdap) 02/21/2024   INFLUENZA VACCINE  02/17/2024   Colonoscopy  05/28/2024   Health Maintenance Items Addressed: DECLINES COLONOSCOPY FOR NOW UNTIL TALKS W/ MD; UP TO DATE ON SHOTS; PNA & SHINGRIX, WANTS NO MORE COVIDS  Additional Screening:  Vision Screening: Recommended annual ophthalmology exams for early detection of glaucoma and other disorders of the eye. Would you like a referral to an eye doctor? No    Dental Screening: Recommended annual dental exams for proper oral hygiene  Community Resource Referral /  Chronic Care Management: CRR required this visit?  No   CCM required this visit?  No   Plan:    I have personally reviewed and noted the following in the patient's chart:   Medical and social history Use of alcohol, tobacco or illicit drugs  Current medications and supplements including opioid prescriptions. Patient is not currently taking opioid prescriptions. Functional ability and status Nutritional status Physical activity Advanced directives List of other physicians Hospitalizations, surgeries, and ER visits in previous 12 months Vitals Screenings to include cognitive, depression, and falls Referrals and appointments  In addition, I have reviewed and discussed with patient certain preventive protocols, quality metrics, and best practice recommendations. A written personalized care plan for preventive services as well as general preventive health recommendations were provided to patient.   Jhonnie GORMAN Das,  LPN   [id number]   After Visit Summary: (MyChart) Due to this being a telephonic visit, the after visit summary with patients personalized plan was offered to patient via MyChart   Notes: Nothing significant to report at this time.

## 2024-03-14 NOTE — Patient Instructions (Signed)
 Bruce Moody , Thank you for taking time out of your busy schedule to complete your Annual Wellness Visit with me. I enjoyed our conversation and look forward to speaking with you again next year. I, as well as your care team,  appreciate your ongoing commitment to your health goals. Please review the following plan we discussed and let me know if I can assist you in the future.   Follow up Visits: 03/20/25 @ 10:50 AM BY PHONE We will see or speak with you next year for your Next Medicare AWV with our clinical staff Have you seen your provider in the last 6 months (3 months if uncontrolled diabetes)? Yes  Clinician Recommendations:  Aim for 30 minutes of exercise or brisk walking, 6-8 glasses of water, and 5 servings of fruits and vegetables each day. TAKE CARE!      This is a list of the screenings recommended for you:  Health Maintenance  Topic Date Due   Zoster (Shingles) Vaccine (1 of 2) Never done   COVID-19 Vaccine (4 - 2024-25 season) 03/20/2023   DTaP/Tdap/Td vaccine (2 - Td or Tdap) 02/21/2024   Flu Shot  02/17/2024   Colon Cancer Screening  05/28/2024   Medicare Annual Wellness Visit  03/14/2025   Pneumococcal Vaccine for age over 11  Completed   Hepatitis C Screening  Completed   HPV Vaccine  Aged Out   Meningitis B Vaccine  Aged Out    Advanced directives: (ACP Link)Information on Advanced Care Planning can be found at [place]  Best boy Advance Health Care Directives Advance Health Care Directives. [email or web address]  Advance Care Planning is important because it:  [x]  Makes sure you receive the medical care that is consistent with your values, goals, and preferences  [x]  It provides guidance to your family and loved ones and reduces their decisional burden about whether or not they are making the right decisions based on your wishes.  Follow the link provided in your after visit summary or read over the paperwork we have mailed to you to help you started getting your  Advance Directives in place. If you need assistance in completing these, please reach out to us  so that we can help you!

## 2024-07-17 ENCOUNTER — Encounter: Payer: Self-pay | Admitting: Ophthalmology

## 2024-07-24 ENCOUNTER — Other Ambulatory Visit: Payer: Self-pay

## 2024-07-24 ENCOUNTER — Encounter: Payer: Self-pay | Admitting: Ophthalmology

## 2024-07-24 ENCOUNTER — Ambulatory Visit: Payer: Self-pay | Admitting: Anesthesiology

## 2024-07-24 ENCOUNTER — Encounter: Payer: Self-pay | Admitting: Anesthesiology

## 2024-07-24 ENCOUNTER — Encounter
Admission: RE | Disposition: A | Discharge status: Home / Self Care | Payer: Self-pay | Source: Home / Self Care | Attending: Ophthalmology

## 2024-07-24 ENCOUNTER — Ambulatory Visit
Admission: RE | Admit: 2024-07-24 | Discharge: 2024-07-24 | Disposition: A | Discharge status: Home / Self Care | Attending: Ophthalmology | Admitting: Ophthalmology

## 2024-07-24 DIAGNOSIS — I25810 Atherosclerosis of coronary artery bypass graft(s) without angina pectoris: Secondary | ICD-10-CM | POA: Diagnosis not present

## 2024-07-24 DIAGNOSIS — H2512 Age-related nuclear cataract, left eye: Secondary | ICD-10-CM | POA: Insufficient documentation

## 2024-07-24 DIAGNOSIS — Z955 Presence of coronary angioplasty implant and graft: Secondary | ICD-10-CM | POA: Insufficient documentation

## 2024-07-24 DIAGNOSIS — Z87891 Personal history of nicotine dependence: Secondary | ICD-10-CM | POA: Insufficient documentation

## 2024-07-24 DIAGNOSIS — I10 Essential (primary) hypertension: Secondary | ICD-10-CM | POA: Diagnosis not present

## 2024-07-24 DIAGNOSIS — I252 Old myocardial infarction: Secondary | ICD-10-CM | POA: Diagnosis not present

## 2024-07-24 DIAGNOSIS — Z7902 Long term (current) use of antithrombotics/antiplatelets: Secondary | ICD-10-CM | POA: Insufficient documentation

## 2024-07-24 DIAGNOSIS — Z951 Presence of aortocoronary bypass graft: Secondary | ICD-10-CM | POA: Insufficient documentation

## 2024-07-24 HISTORY — DX: Personal history of nicotine dependence: Z87.891

## 2024-07-24 HISTORY — DX: Atherosclerosis of coronary artery bypass graft(s), unspecified, with other forms of angina pectoris: I25.708

## 2024-07-24 HISTORY — DX: Ischemic cardiomyopathy: I25.5

## 2024-07-24 HISTORY — DX: Presence of coronary angioplasty implant and graft: Z95.5

## 2024-07-24 HISTORY — PX: CATARACT EXTRACTION W/PHACO: SHX586

## 2024-07-24 SURGERY — PHACOEMULSIFICATION, CATARACT, WITH IOL INSERTION
Anesthesia: Monitor Anesthesia Care | Laterality: Left

## 2024-07-24 MED ORDER — MIDAZOLAM HCL 2 MG/2ML IJ SOLN
INTRAMUSCULAR | Status: AC
  Filled 2024-07-24: qty 2

## 2024-07-24 MED ORDER — SIGHTPATH DOSE#1 BSS IO SOLN
INTRAOCULAR | Status: DC | PRN
  Administered 2024-07-24: 64 mL via OPHTHALMIC

## 2024-07-24 MED ORDER — CYCLOPENTOLATE HCL 2 % OP SOLN
OPHTHALMIC | Status: AC
  Filled 2024-07-24: qty 2

## 2024-07-24 MED ORDER — SIGHTPATH DOSE#1 NA CHONDROIT SULF-NA HYALURON 40-17 MG/ML IO SOLN
INTRAOCULAR | Status: DC | PRN
  Administered 2024-07-24: 1 mL via INTRAOCULAR

## 2024-07-24 MED ORDER — CYCLOPENTOLATE HCL 2 % OP SOLN
1.0000 [drp] | OPHTHALMIC | Status: AC | PRN
  Administered 2024-07-24 (×3): 1 [drp] via OPHTHALMIC

## 2024-07-24 MED ORDER — PHENYLEPHRINE HCL 10 % OP SOLN
OPHTHALMIC | Status: AC
  Filled 2024-07-24: qty 5

## 2024-07-24 MED ORDER — LIDOCAINE HCL (PF) 2 % IJ SOLN
INTRAOCULAR | Status: DC | PRN
  Administered 2024-07-24: 1 mL

## 2024-07-24 MED ORDER — PHENYLEPHRINE HCL 10 % OP SOLN
1.0000 [drp] | OPHTHALMIC | Status: AC | PRN
  Administered 2024-07-24 (×3): 1 [drp] via OPHTHALMIC

## 2024-07-24 MED ORDER — TETRACAINE HCL 0.5 % OP SOLN
1.0000 [drp] | OPHTHALMIC | Status: DC | PRN
  Administered 2024-07-24 (×3): 1 [drp] via OPHTHALMIC

## 2024-07-24 MED ORDER — SIGHTPATH DOSE#1 BSS IO SOLN
INTRAOCULAR | Status: DC | PRN
  Administered 2024-07-24: 15 mL via INTRAOCULAR

## 2024-07-24 MED ORDER — MOXIFLOXACIN HCL 0.5 % OP SOLN
OPHTHALMIC | Status: DC | PRN
  Administered 2024-07-24: .2 mL via OPHTHALMIC

## 2024-07-24 MED ORDER — BRIMONIDINE TARTRATE-TIMOLOL 0.2-0.5 % OP SOLN
OPHTHALMIC | Status: DC | PRN
  Administered 2024-07-24: 1 [drp] via OPHTHALMIC

## 2024-07-24 MED ORDER — TETRACAINE HCL 0.5 % OP SOLN
OPHTHALMIC | Status: AC
  Filled 2024-07-24: qty 4

## 2024-07-24 MED ORDER — MIDAZOLAM HCL (PF) 2 MG/2ML IJ SOLN
INTRAMUSCULAR | Status: DC | PRN
  Administered 2024-07-24: 1.5 mg via INTRAVENOUS

## 2024-07-24 SURGICAL SUPPLY — 9 items
CANNULA ANT/CHMB 27G (MISCELLANEOUS) ×1 IMPLANT
CYSTOTOME ANGL RVRS SHRT 25G (CUTTER) ×1 IMPLANT
FEE CATARACT SUITE SIGHTPATH (MISCELLANEOUS) ×1 IMPLANT
GLOVE BIOGEL PI IND STRL 8 (GLOVE) ×1 IMPLANT
GLOVE SURG LX STRL 8.0 MICRO (GLOVE) ×1 IMPLANT
GLOVE SURG SYN 6.5 PF PI BL (GLOVE) ×1 IMPLANT
LENS IOL TECNIS EYHANCE 20.0 (Intraocular Lens) IMPLANT
NEEDLE FILTER BLUNT 18X1 1/2 (NEEDLE) ×1 IMPLANT
SYR 3ML LL SCALE MARK (SYRINGE) ×1 IMPLANT

## 2024-07-24 NOTE — Op Note (Signed)
 PREOPERATIVE DIAGNOSIS:  Nuclear sclerotic cataract of the left eye.   POSTOPERATIVE DIAGNOSIS:  Nuclear sclerotic cataract of the left eye.   OPERATIVE PROCEDURE:ORPROCALL@   SURGEON:  Elsie Carmine, MD.   ANESTHESIA:  Anesthesiologist: Leavy Ned, MD CRNA: Pearl Ozell PARAS, CRNA  1.      Managed anesthesia care. 2.     0.38ml of Shugarcaine was instilled following the paracentesis   COMPLICATIONS:  None.   TECHNIQUE:   Stop and chop   DESCRIPTION OF PROCEDURE:  The patient was examined and consented in the preoperative holding area where the aforementioned topical anesthesia was applied to the left eye and then brought back to the Operating Room where the left eye was prepped and draped in the usual sterile ophthalmic fashion and a lid speculum was placed. A paracentesis was created with the side port blade and the anterior chamber was filled with viscoelastic. A near clear corneal incision was performed with the steel keratome. A continuous curvilinear capsulorrhexis was performed with a cystotome followed by the capsulorrhexis forceps. Hydrodissection and hydrodelineation were carried out with BSS on a blunt cannula. The lens was removed in a stop and chop  technique and the remaining cortical material was removed with the irrigation-aspiration handpiece. The capsular bag was inflated with viscoelastic and the intraocular lens was placed in the capsular bag without complication. The remaining viscoelastic was removed from the eye with the irrigation-aspiration handpiece. The wounds were hydrated. The anterior chamber was flushed with BSS and the eye was inflated to physiologic pressure. 0.22ml Vigamox  was placed in the anterior chamber. The wounds were found to be water tight. The eye was dressed with Combigan . The patient was given protective glasses to wear throughout the day and a shield with which to sleep tonight. The patient was also given drops with which to begin a drop regimen  today and will follow-up with me in one day. Implant Name Type Inv. Item Serial No. Manufacturer Lot No. LRB No. Used Action  LENS IOL TECNIS EYHANCE 20.0 - [id number] Intraocular Lens LENS IOL TECNIS EYHANCE 20.0 [id number] [employer]  Left 1 Implanted    Procedures: PHACOEMULSIFICATION, CATARACT, WITH IOL INSERTION 11.76, 01:04.4 (Left)  Electronically signed: Elsie Carmine 07/24/2024 9:10 AM

## 2024-07-24 NOTE — Anesthesia Preprocedure Evaluation (Signed)
 "                                  Anesthesia Evaluation  Patient identified by MRN, date of birth, ID band Patient awake  General Assessment Comment:  Plavix  last taken 5 days ago.  Reviewed: Allergy & Precautions, NPO status , Patient's Chart, lab work & pertinent test results  History of Anesthesia Complications Negative for: history of anesthetic complications  Airway Mallampati: III  TM Distance: >3 FB Neck ROM: Full    Dental no notable dental hx. (+) Teeth Intact   Pulmonary neg pulmonary ROS, neg sleep apnea, neg COPD, Patient abstained from smoking.Not current smoker, former smoker   Pulmonary exam normal breath sounds clear to auscultation       Cardiovascular Exercise Tolerance: Good hypertension, Pt. on medications + CAD, + Past MI, + Cardiac Stents and + CABG  (-) dysrhythmias  Rhythm:Regular Rate:Normal - Systolic murmurs Stress test 2021: FINDINGS:  Regional wall motion: demonstrates hypokinesis of the apical wall.  The overall quality of the study is good.   Artifacts noted: no  Left ventricular cavity: normal.   Perfusion Analysis: SPECT images demonstrate small perfusion abnormality  of mild intensity is present in the apical myocardial region on the stress  images. Resting images show small perfusion abnormality of mild intensity  of the apical myocardial region consistent with previous infarct and/or  scar without evidence of ischemia  defect type : Fixed   TTE 2021: INTERPRETATION  MILD LV SYSTOLIC DYSFUNCTION (See above)  NORMAL RIGHT VENTRICULAR SYSTOLIC FUNCTION  MILD VALVULAR REGURGITATION (See above)  NO VALVULAR STENOSIS  MILD TR, PR  TRIVIAL MR  EF 45%     Neuro/Psych negative neurological ROS  negative psych ROS   GI/Hepatic ,neg GERD  ,,(+)     (-) substance abuse    Endo/Other  neg diabetes    Renal/GU      Musculoskeletal   Abdominal   Peds  Hematology   Anesthesia Other Findings Past  Medical History: No date: Arthritis     Comment:  right hip 2011: Cancer (HCC)     Comment:  Melanoma pn forehead 2017 and upper back 2011 No date: Coronary artery disease No date: Elevated lipids No date: Heart disease No date: History of kidney stones No date: Hyperlipidemia No date: Hypertension 1998: Myocardial infarction (HCC)  Reproductive/Obstetrics                              Anesthesia Physical Anesthesia Plan  ASA: 3  Anesthesia Plan: MAC   Post-op Pain Management:    Induction: Intravenous  PONV Risk Score and Plan: 1 and TIVA and Midazolam   Airway Management Planned: Natural Airway and Nasal Cannula  Additional Equipment:   Intra-op Plan:   Post-operative Plan:   Informed Consent: I have reviewed the patients History and Physical, chart, labs and discussed the procedure including the risks, benefits and alternatives for the proposed anesthesia with the patient or authorized representative who has indicated his/her understanding and acceptance.     Dental Advisory Given  Plan Discussed with: Anesthesiologist, CRNA and Surgeon  Anesthesia Plan Comments: (Patient consented for risks of anesthesia including but not limited to:  - adverse reactions to medications - damage to eyes, teeth, lips or other oral mucosa - nerve damage due to positioning  - sore throat or hoarseness -  Damage to heart, brain, nerves, lungs, other parts of body or loss of life  Patient voiced understanding and assent.)        Anesthesia Quick Evaluation  "

## 2024-07-24 NOTE — Transfer of Care (Signed)
 Immediate Anesthesia Transfer of Care Note  Patient: Bruce Moody  Procedure(s) Performed: PHACOEMULSIFICATION, CATARACT, WITH IOL INSERTION 11.76, 01:04.4 (Left)  Patient Location: PACU  Anesthesia Type: MAC  Level of Consciousness: awake, alert  and patient cooperative  Airway and Oxygen Therapy: Patient Spontanous Breathing and Patient connected to supplemental oxygen  Post-op Assessment: Post-op Vital signs reviewed, Patient's Cardiovascular Status Stable, Respiratory Function Stable, Patent Airway and No signs of Nausea or vomiting  Post-op Vital Signs: Reviewed and stable  Complications: No notable events documented.

## 2024-07-24 NOTE — Anesthesia Postprocedure Evaluation (Signed)
"   Anesthesia Post Note  Patient: Laith L Lust  Procedure(s) Performed: PHACOEMULSIFICATION, CATARACT, WITH IOL INSERTION 11.76, 01:04.4 (Left)  Patient location during evaluation: PACU Anesthesia Type: MAC Level of consciousness: awake and alert Pain management: pain level controlled Vital Signs Assessment: post-procedure vital signs reviewed and stable Respiratory status: spontaneous breathing, nonlabored ventilation, respiratory function stable and patient connected to nasal cannula oxygen Cardiovascular status: blood pressure returned to baseline and stable Postop Assessment: no apparent nausea or vomiting Anesthetic complications: no   No notable events documented.   Last Vitals:  Vitals:   07/24/24 0915 07/24/24 0918  BP: 107/66   Pulse: 71 69  Resp: 13 13  Temp:    SpO2: 99% 99%    Last Pain:  Vitals:   07/24/24 0918  TempSrc:   PainSc: 0-No pain                 Debby Mines      "

## 2024-07-24 NOTE — H&P (Signed)
 [facility]   Primary Care Physician:  Gasper Nancyann BRAVO, MD Ophthalmologist: Dr. Elsie Carmine  Pre-Procedure History & Physical: HPI:  Bruce Moody is a 74 y.o. male here for cataract surgery.   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5;  Surgeon: Carmine Elsie, MD;  Location: [facility];  Service: Ophthalmology;  Laterality: Right;   COLONOSCOPY  2004   CORONARY ANGIOPLASTY     CORONARY ARTERY BYPASS GRAFT  1998   CORONARY ARTERY BYPASS GRAFT     3 vessels   HERNIA REPAIR Left 2008   inguinal   HERNIA REPAIR Right 07/05/2013   inguinal    JOINT REPLACEMENT  2017   LEFT HEART CATH AND CORS/GRAFTS ANGIOGRAPHY N/A 01/13/2022   Procedure: LEFT HEART CATH AND CORS/GRAFTS ANGIOGRAPHY;  Surgeon: Hester Wolm PARAS, MD;  Location: [facility];  Service: Cardiovascular;  Laterality: N/A;   MOHS SURGERY  02/03/2022   ROTATOR CUFF REPAIR  2005   TONSILLECTOMY     TOTAL HIP ARTHROPLASTY Left 06/08/2016   Procedure: TOTAL HIP ARTHROPLASTY ANTERIOR APPROACH;  Surgeon: Ozell Flake, MD;  Location: [facility];  Service: Orthopedics;  Laterality: Left;   TOTAL HIP ARTHROPLASTY Right 03/03/2021    Procedure: TOTAL HIP ARTHROPLASTY ANTERIOR APPROACH;  Surgeon: Flake Ozell, MD;  Location: [facility];  Service: Orthopedics;  Laterality: Right;    Prior to Admission medications  Medication Sig Start Date End Date Taking? Authorizing Provider  acetaminophen  (TYLENOL ) 500 MG tablet Take 500-1,000 mg by mouth every 6 (six) hours as needed for moderate pain.   Yes [provider]  aspirin  EC 81 MG tablet Take 81 mg by mouth daily.   Yes [provider]  atorvastatin  (LIPITOR) 80 MG tablet Take 1 tablet (80 mg total) by mouth daily. 12/16/17  Yes Gasper Nancyann BRAVO, MD  carvedilol  (COREG ) 3.125 MG tablet Take 3.125 mg by mouth 2 (two) times daily with a meal.  04/03/13  Yes [provider]  clopidogrel  (PLAVIX ) 75 MG tablet Take 75 mg by mouth daily.  04/03/13  Yes [provider]  omeprazole  (PRILOSEC) 40 MG capsule TAKE 1 CAPSULE BY MOUTH DAILY 11/05/23  Yes Gasper Nancyann BRAVO, MD  ramipril  (ALTACE ) 2.5 MG capsule Take 1 capsule by mouth daily. 07/28/21  Yes [provider]  ezetimibe (ZETIA) 10 MG tablet Take 1 tablet by mouth daily. 03/29/22 03/14/24  [provider]  nitroGLYCERIN  (NITROSTAT ) 0.4 MG SL tablet Place 1 tablet (0.4 mg total) under the tongue every 5 (five) minutes as needed for chest pain. 11/06/21  Cyndi Shaver, PA-C    Allergies as of 06/26/2024   (No Known Allergies)    Family History  Problem Relation Age of Onset   Lymphoma Father    Heart disease Mother    Dementia Mother    Arthritis Sister    Heart disease Sister    COPD Sister     Social History   Socioeconomic History   Marital status: Married    Spouse name: Not on file   Number of children: 1   Years of education: Not on file   Highest education level: Bachelor's degree (e.g., BA, AB, BS)  Occupational History   Not on file  Tobacco Use   Smoking status: Former    Current packs/day: 0.00    Average packs/day: 1 pack/day for 20.0 years (20.0 ttl  pk-yrs)    Types: Cigarettes    Quit date: 12/16/1996    Years since quitting: 27.6   Smokeless tobacco: Never  Vaping Use   Vaping status: Never Used  Substance and Sexual Activity   Alcohol use: Yes    Alcohol/week: 5.0 - 6.0 standard drinks of alcohol    Comment: occasionally   Drug use: No   Sexual activity: Yes    Birth control/protection: Other-see comments    Comment: Vasectomy  Other Topics Concern   Not on file  Social History Narrative   Not on file   Social Drivers of Health   Tobacco Use: Medium Risk (07/17/2024)   Patient History    Smoking Tobacco Use: Former    Smokeless Tobacco Use: Never    Passive Exposure: Not on Actuary Strain: Low Risk (03/14/2024)   Overall Financial Resource Strain (CARDIA)    Difficulty of Paying Living Expenses: Not hard at all  Food Insecurity: No Food Insecurity (03/14/2024)   Epic    Worried About Radiation Protection Practitioner of Food in the Last Year: Never true    Ran Out of Food in the Last Year: Never true  Transportation Needs: No Transportation Needs (03/14/2024)   Epic    Lack of Transportation (Medical): No    Lack of Transportation (Non-Medical): No  Physical Activity: Sufficiently Active (03/14/2024)   Exercise Vital Sign    Days of Exercise per Week: 5 days    Minutes of Exercise per Session: 30 min  Stress: No Stress Concern Present (03/14/2024)   [employer] of Occupational Health - Occupational Stress Questionnaire    Feeling of Stress: Not at all  Social Connections: Unknown (03/14/2024)   Social Connection and Isolation Panel    Frequency of Communication with Friends and Family: Patient declined    Frequency of Social Gatherings with Friends and Family: Patient declined    Attends Religious Services: Patient declined    Active Member of Clubs or Organizations: No    Attends Banker Meetings: Never    Marital Status: Married  Catering Manager Violence: Not At Risk (03/14/2024)   Epic    Fear  of Current or Ex-Partner: No    Emotionally Abused: No    Physically Abused: No    Sexually Abused: No  Depression (PHQ2-9): Low Risk (03/14/2024)   Depression (PHQ2-9)    PHQ-2 Score: 0  Alcohol Screen: Low Risk (03/14/2024)   Alcohol Screen    Last Alcohol Screening Score (AUDIT): 4  Housing: Unknown (03/14/2024)   Epic    Unable to Pay for Housing in the Last Year: No    Number of Times Moved in the Last  Year: Not on file    Homeless in the Last Year: No  Utilities: Not At Risk (03/14/2024)   Epic    Threatened with loss of utilities: No  Health Literacy: Adequate Health Literacy (03/14/2024)   B1300 Health Literacy    Frequency of need for help with medical instructions: Never    Review of Systems: See HPI, otherwise negative ROS  Physical Exam: BP 132/64   Pulse (!) 59   Temp 98.1 F (36.7 C) (Temporal)   Resp 12   Ht 5' 3 (1.6 m)   Wt 57.6 kg   SpO2 99%   BMI 22.50 kg/m  General:   Alert, cooperative. Head:  Normocephalic and atraumatic. Respiratory:  Normal work of breathing. Cardiovascular:  NAD  Impression/Plan: Bruce Moody is here for cataract surgery.  Risks, benefits, limitations, and alternatives regarding cataract surgery have been reviewed with the patient.  Questions have been answered.  All parties agreeable.   Elsie Carmine, MD  07/24/2024, 8:43 AM

## 2024-07-24 NOTE — Discharge Instructions (Signed)

## 2024-07-25 ENCOUNTER — Encounter: Payer: Self-pay | Admitting: Ophthalmology

## 2025-03-20 ENCOUNTER — Ambulatory Visit
